# Patient Record
Sex: Male | Born: 1985 | Race: Black or African American | Hispanic: No | Marital: Married | Smoking: Former smoker
Health system: Southern US, Community
[De-identification: ages and names within clinical notes are randomized; demographics above are authoritative.]

## PROBLEM LIST (undated history)

## (undated) HISTORY — PX: WISDOM TOOTH EXTRACTION: SHX21

---

## 2006-11-22 ENCOUNTER — Emergency Department: Payer: Self-pay | Admitting: Emergency Medicine

## 2008-12-19 IMAGING — CR DG CHEST 2V
1 series · 2 of 2 positions shown · non-contrast
Comparison: none

REASON FOR EXAM: sob-[REDACTED]
COMMENTS:

PROCEDURE:     DXR - DXR CHEST PA (OR AP) AND LATERAL  - November 22, 2006 [DATE]
RESULT:     The lungs are mildly hyperinflated but clear. The heart and
pulmonary vascularity are normal in appearance. There is no evidence of
pneumothorax nor pleural effusion.

[Series 1: view not recorded · 0.17mm/px · 2 of 2 slices shown]
[im 1/2]
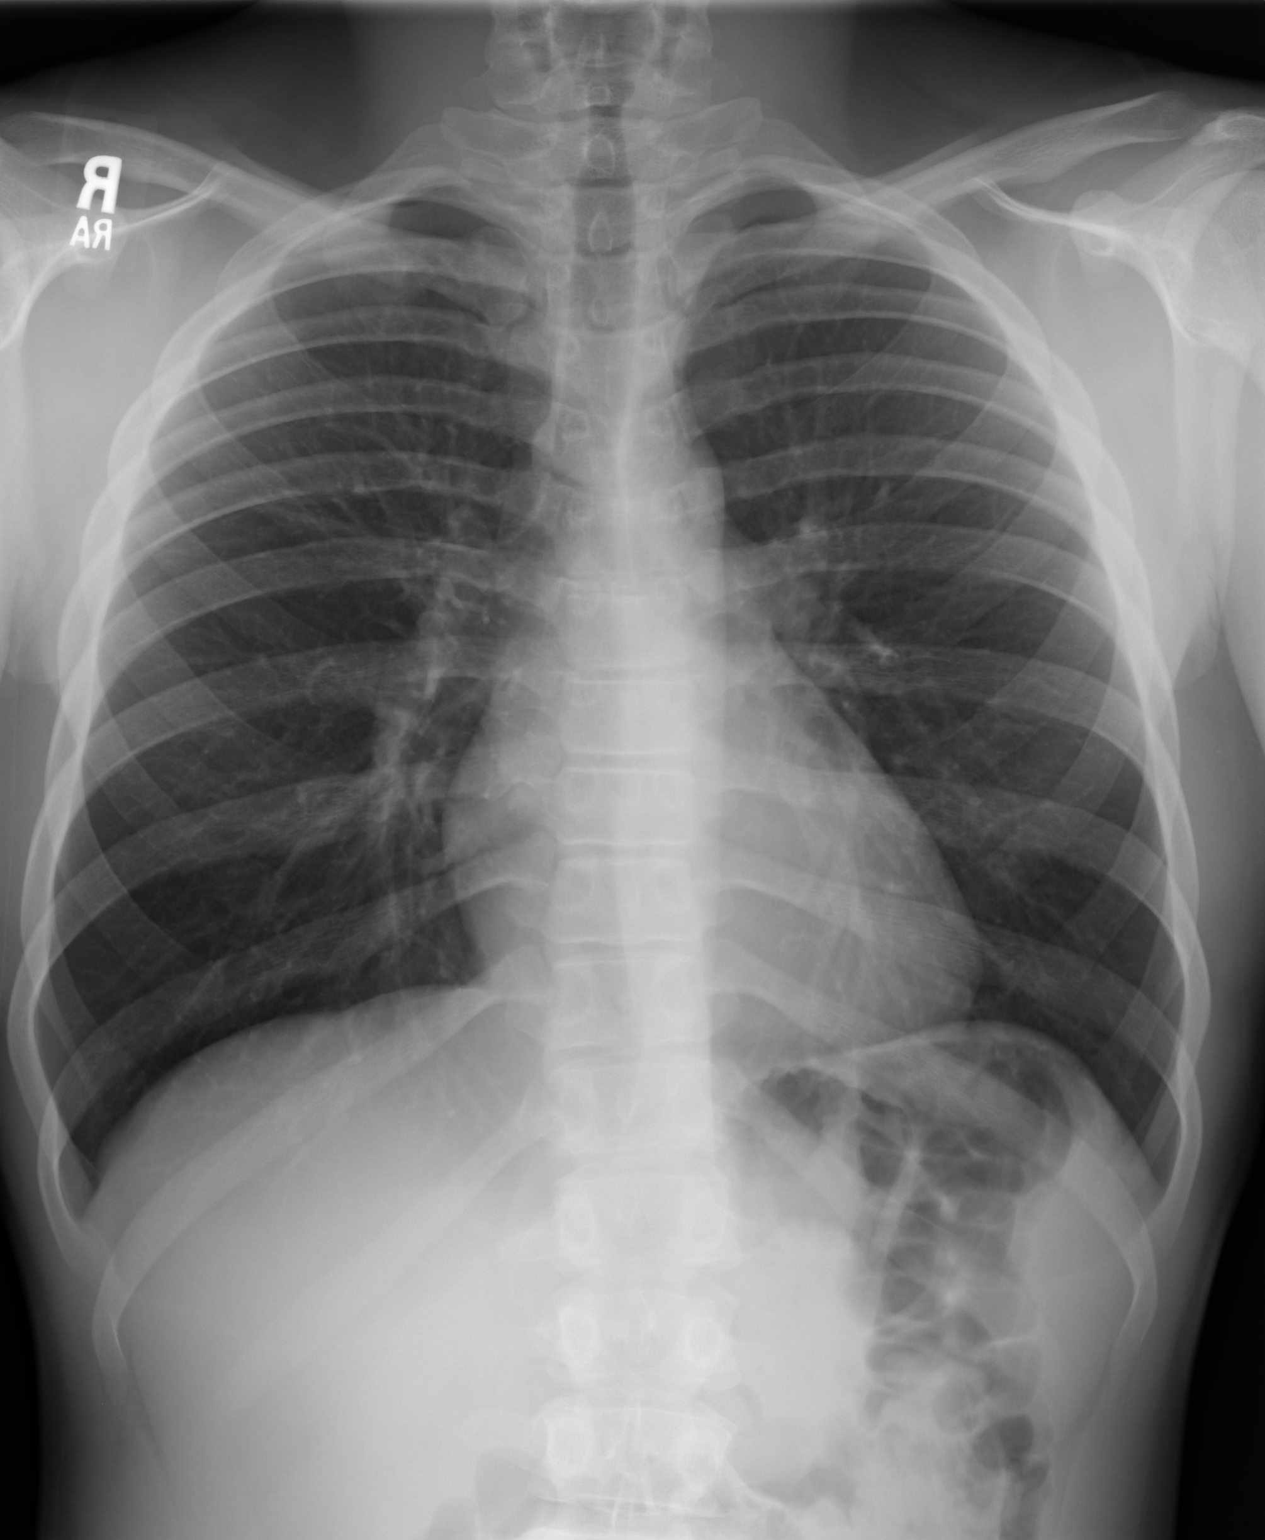
[im 2/2]
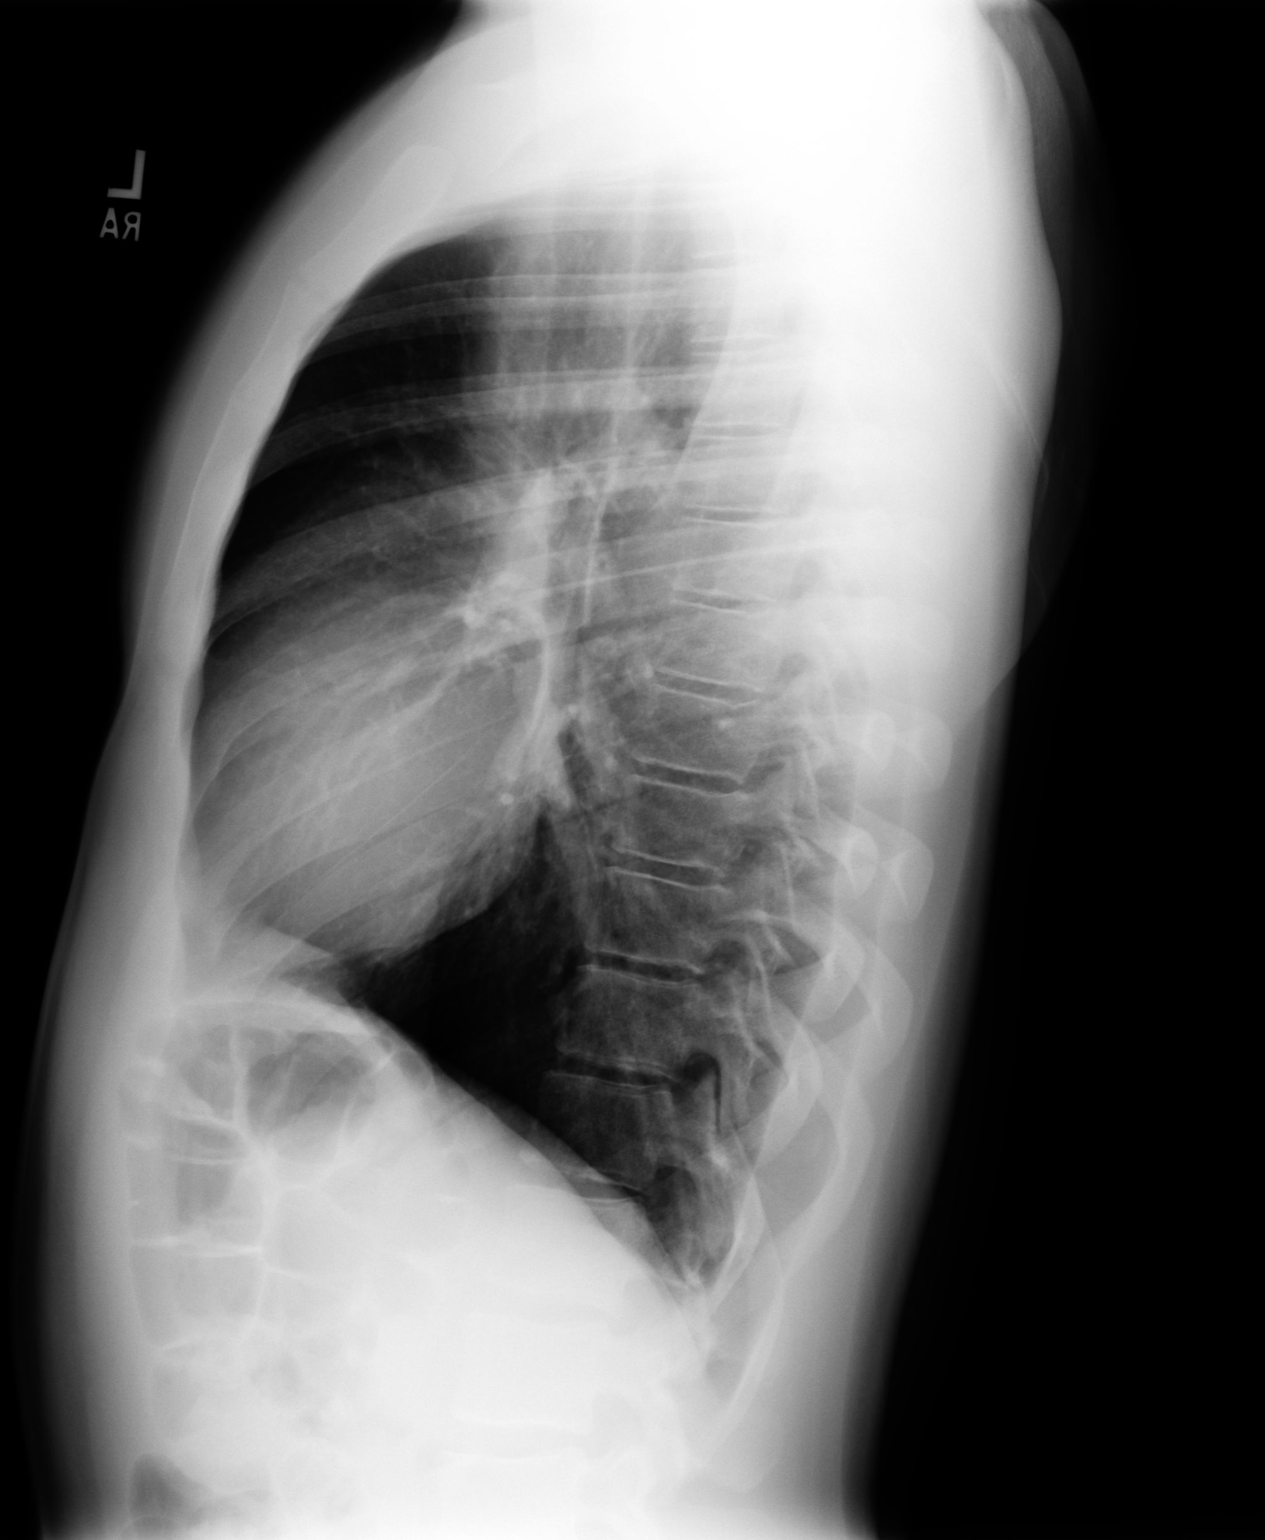

[2 of 2 positions shown; findings below may reference images not displayed]

IMPRESSION: There is mild hyperinflation which may be voluntary or which may reflect an
element of underlying reactive airway disease. I do not see evidence of
pneumonia.

## 2012-04-05 ENCOUNTER — Encounter (HOSPITAL_BASED_OUTPATIENT_CLINIC_OR_DEPARTMENT_OTHER): Payer: Self-pay | Admitting: *Deleted

## 2012-04-05 ENCOUNTER — Emergency Department (HOSPITAL_BASED_OUTPATIENT_CLINIC_OR_DEPARTMENT_OTHER)
Admission: EM | Admit: 2012-04-05 | Discharge: 2012-04-05 | Disposition: A | Discharge status: Home / Self Care | Payer: Self-pay | Attending: Emergency Medicine | Admitting: Emergency Medicine

## 2012-04-05 DIAGNOSIS — B353 Tinea pedis: Secondary | ICD-10-CM

## 2012-04-05 DIAGNOSIS — F17200 Nicotine dependence, unspecified, uncomplicated: Secondary | ICD-10-CM | POA: Insufficient documentation

## 2012-04-05 MED ORDER — TOLNAFTATE 1 % EX POWD: Freq: Two times a day (BID) | CUTANEOUS | Status: AC

## 2012-04-05 NOTE — ED Notes (Signed)
Pt c/o rash to bottom of foot near toes x 5 days and sts "I think it might be athlete's foot". Pt sts he tried otc med without relief.

## 2012-04-05 NOTE — ED Provider Notes (Signed)
History     CSN: [id number]  Arrival date & time 04/05/12  1519   First MD Initiated Contact with Patient 04/05/12 1537      Chief Complaint  Patient presents with  . Foot Pain    (Consider location/radiation/quality/duration/timing/severity/associated sxs/prior treatment) Patient is a 26 y.o. male presenting with rash. The history is provided by the patient.  Rash  The current episode started more than 2 days ago. The problem has been gradually worsening. The problem is associated with an unknown factor. There has been no fever. The rash is present on the right toes and right foot. The pain is at a severity of 1/10. The pain is mild. The pain has been intermittent since onset. Associated symptoms include pain. Pertinent negatives include no blisters, no itching and no weeping. Treatments tried: Has tried antifungal spray, no sig relief.    History reviewed. No pertinent past medical history.  History reviewed. No pertinent past surgical history.  History reviewed. No pertinent family history.  History  Substance Use Topics  . Smoking status: Current Everyday Smoker  . Smokeless tobacco: Not on file  . Alcohol Use: No      Review of Systems  Constitutional: Negative for fever and chills.  Respiratory: Negative for shortness of breath.   Musculoskeletal: Negative for joint swelling and arthralgias.  Skin: Positive for rash. Negative for itching and wound.    Allergies  Percocet  Home Medications  No current outpatient prescriptions on file.  BP 139/67  Pulse 112  Temp 98 F (36.7 C) (Oral)  Resp 16  Ht 5\' 9"  (1.753 m)  Wt 185 lb (83.915 kg)  BMI 27.32 kg/m2  SpO2 99%  Physical Exam  Nursing note and vitals reviewed. Constitutional: He is oriented to person, place, and time. He appears well-developed and well-nourished. No distress.  Musculoskeletal:       Right foot: He exhibits normal range of motion, normal capillary refill, no crepitus, no deformity and  no laceration.       Feet:  Neurological: He is alert and oriented to person, place, and time. He has normal strength. No sensory deficit.  Skin: Skin is warm.    ED Course  Procedures (including critical care time)  Labs Reviewed - No data to display No results found.   1. Athlete's foot       MDM  Pt with denuding skin likely from tinea pedis.  Counseled about cleaning feet, drying, changing shoes and socks in particular often and several weeks of treatment.  Will suggest powder instead of just spray.          Gavin Pound. Lorijean Husser, MD 04/05/12 1553

## 2012-04-05 NOTE — Discharge Instructions (Signed)
Athlete's Foot Athlete's foot (tinea pedis) is a fungal infection of the skin on the feet. It often occurs on the skin between the toes or underneath the toes. It can also occur on the soles of the feet. Athlete's foot is more likely to occur in hot, humid weather. Not washing your feet or changing your socks often enough can contribute to athlete's foot. The infection can spread from person to person (contagious). CAUSES Athlete's foot is caused by a fungus. This fungus thrives in warm, moist places. Most people get athlete's foot by sharing shower stalls, towels, and wet floors with an infected person. People with weakened immune systems, including those with diabetes, may be more likely to get athlete's foot. SYMPTOMS   Itchy areas between the toes or on the soles of the feet.   White, flaky, or scaly areas between the toes or on the soles of the feet.   Tiny, intensely itchy blisters between the toes or on the soles of the feet.   Tiny cuts on the skin. These cuts can develop a bacterial infection.   Thick or discolored toenails.  DIAGNOSIS  Your caregiver can usually tell what the problem is by doing a physical exam. Your caregiver may also take a skin sample from the rash area. The skin sample may be examined under a microscope, or it may be tested to see if fungus will grow in the sample. A sample may also be taken from your toenail for testing. TREATMENT  Over-the-counter and prescription medicines can be used to kill the fungus. These medicines are available as powders or creams. Your caregiver can suggest medicines for you. Fungal infections respond slowly to treatment. You may need to continue using your medicine for several weeks. PREVENTION   Do not share towels.   Wear sandals in wet areas, such as shared locker rooms and shared showers.   Keep your feet dry. Wear shoes that allow air to circulate. Wear cotton or wool socks.  HOME CARE INSTRUCTIONS   Take medicines as  directed by your caregiver. Do not use steroid creams on athlete's foot.   Keep your feet clean and cool. Wash your feet daily and dry them thoroughly, especially between your toes.   Change your socks every day. Wear cotton or wool socks. In hot climates, you may need to change your socks 2 to 3 times per day.   Wear sandals or canvas tennis shoes with good air circulation.   If you have blisters, soak your feet in Burow's solution or Epsom salts for 20 to 30 minutes, 2 times a day to dry out the blisters. Make sure you dry your feet thoroughly afterward.  SEEK MEDICAL CARE IF:   You have a fever.   You have swelling, soreness, warmth, or redness in your foot.   You are not getting better after 7 days of treatment.   You are not completely cured after 30 days.   You have any problems caused by your medicines.  MAKE SURE YOU:   Understand these instructions.   Will watch your condition.   Will get help right away if you are not doing well or get worse.  Document Released: 07/14/2000 Document Revised: 07/06/2011 Document Reviewed: 05/05/2011 [employer] Patient Information 2012 [employer]. 

## 2016-05-04 ENCOUNTER — Encounter (HOSPITAL_BASED_OUTPATIENT_CLINIC_OR_DEPARTMENT_OTHER): Payer: Self-pay | Admitting: *Deleted

## 2016-05-04 ENCOUNTER — Emergency Department (HOSPITAL_BASED_OUTPATIENT_CLINIC_OR_DEPARTMENT_OTHER)
Admission: EM | Admit: 2016-05-04 | Discharge: 2016-05-04 | Disposition: A | Discharge status: Home / Self Care | Payer: 59 | Attending: Emergency Medicine | Admitting: Emergency Medicine

## 2016-05-04 DIAGNOSIS — F17200 Nicotine dependence, unspecified, uncomplicated: Secondary | ICD-10-CM | POA: Insufficient documentation

## 2016-05-04 DIAGNOSIS — L0291 Cutaneous abscess, unspecified: Secondary | ICD-10-CM

## 2016-05-04 DIAGNOSIS — R0981 Nasal congestion: Secondary | ICD-10-CM | POA: Insufficient documentation

## 2016-05-04 DIAGNOSIS — R220 Localized swelling, mass and lump, head: Secondary | ICD-10-CM | POA: Diagnosis present

## 2016-05-04 NOTE — Discharge Instructions (Signed)
Try warm compresses.

## 2016-05-04 NOTE — ED Notes (Signed)
C/o swelling in upper lip and into nostrils onset last pm,  Denies any inj or sting

## 2016-05-04 NOTE — ED Provider Notes (Signed)
[facility] Note   CSN: [id number] Arrival date & time: 05/04/16  1936  By signing my name below, I, Ryan Powers, attest that this documentation has been prepared under the direction and in the presence of Ryan Plan, DO. Electronically Signed: Angelene Powers, ED Scribe. 05/04/16. 8:23 PM.   History   Chief Complaint Chief Complaint  Patient presents with  . Oral Swelling    HPI Comments: Ryan Powers is a 30 y.o. male who presents to the Emergency Department complaining of gradual onset, persistent moderately painful swelling to this upper lip that extends up into his nostrils onset yesterday. He reports associated nasal congestion and clear rhinorrhea. No alleviating factors noted. Pt states that he tried Benadryl at 7 am this morning with no relief. He denies any new foods or lotions. He has an allergy to percocet. He denies any fever, chills, difficulty breathing, or any generalized rash.  The history is provided by the patient. No language interpreter was used.    History reviewed. No pertinent past medical history.  There are no active problems to display for this patient.   History reviewed. No pertinent surgical history.     Home Medications    Prior to Admission medications   Medication Sig Start Date End Date Taking? Authorizing Provider  acetaminophen (TYLENOL) 500 MG tablet Take 1,000 mg by mouth every 6 (six) hours as needed. For headache.    Historical Provider, MD    Family History No family history on file.  Social History Social History  Substance Use Topics  . Smoking status: Current Every Day Smoker  . Smokeless tobacco: Never Used  . Alcohol use Yes     Allergies   Percocet [oxycodone-acetaminophen]   Review of Systems Review of Systems  Constitutional: Negative for chills and fever.  HENT: Positive for congestion, facial swelling and rhinorrhea.   Eyes: Negative for discharge and visual disturbance.    Respiratory: Negative for shortness of breath.   Cardiovascular: Negative for chest pain and palpitations.  Gastrointestinal: Negative for abdominal pain, diarrhea and vomiting.  Musculoskeletal: Negative for arthralgias and myalgias.  Skin: Negative for color change and rash.  Neurological: Negative for tremors, syncope and headaches.  Psychiatric/Behavioral: Negative for confusion and dysphoric mood.     Physical Exam Updated Vital Powers BP 123/72   Pulse 95   Temp 99 F (37.2 C) (Oral)   Resp 20   Ht 5\' 9"  (1.753 m)   Wt 197 lb (89.4 kg)   SpO2 99%   BMI 29.09 kg/m   Physical Exam  Constitutional: He is oriented to person, place, and time. He appears well-developed and well-nourished.  HENT:  Head: Normocephalic and atraumatic.  No appreciable swelling, tenderness to intersection of bilateral nares Swollen turbinates bilaterally No intra-oral lesions No lesions into upper lip  Eyes: EOM are normal. Pupils are equal, round, and reactive to light.  Neck: Normal range of motion. Neck supple. No JVD present.  Cardiovascular: Normal rate and regular rhythm.  Exam reveals no gallop and no friction rub.   No murmur heard. Pulmonary/Chest: No respiratory distress. He has no wheezes.  Abdominal: He exhibits no distension. There is no rebound and no guarding.  Musculoskeletal: Normal range of motion.  Neurological: He is alert and oriented to person, place, and time.  Skin: No rash noted. No pallor.  Psychiatric: He has a normal mood and affect. His behavior is normal.  Nursing note and vitals reviewed.    ED Treatments / Results  DIAGNOSTIC STUDIES: Oxygen Saturation is 99% on RA, normal by my interpretation.    COORDINATION OF CARE: 8:13 PM- Pt advised of Powers for treatment and pt agrees.    Labs (all labs ordered are listed, but only abnormal results are displayed) Labs Reviewed - No data to display  EKG  EKG Interpretation None       Radiology No results  found.  Procedures Procedures (including critical care time)  Medications Ordered in ED Medications - No data to display   Initial Impression / Assessment and Powers / ED Course  Ryan Powers and the nursing notes.  Pertinent labs & imaging results that were available during my care of the patient were reviewed by me and considered in my medical decision making (see chart for details).  Clinical Course    30 yo M With a chief complaint of swelling at the bottom of his nose. Going on for the past couple days. Has painful denies fevers or chills. Has some mild congestion. Denies other symptoms. Suspect that this is a small abscess though I'm not able to appreciate any significant areas. The patient try warm compresses Tylenol and ibuprofen.  10:20 PM:  I have discussed the diagnosis/risks/treatment options with the patient and family and believe the pt to be eligible for discharge home to follow-up with PCP. We also discussed returning to the ED immediately if new or worsening sx occur. We discussed the sx which are most concerning (e.g., sudden worsening pain, fever, inability to tolerate by mouth) that necessitate immediate return. Medications administered to the patient during their visit and any new prescriptions provided to the patient are listed below.  Medications given during this visit Medications - No data to display   The patient appears reasonably screen and/or stabilized for discharge and I doubt any other medical condition or other [facility] requiring further screening, evaluation, or treatment in the ED at this time prior to discharge.    Final Clinical Impressions(s) / ED Diagnoses   Final diagnoses:  Abscess    New Prescriptions Discharge Medication List as of 05/04/2016  8:14 PM      I personally performed the services described in this documentation, which was scribed in my presence. The recorded information has been reviewed and is  accurate.     Ryan Planan Anahi Belmar, DO 05/04/16 2220

## 2016-05-04 NOTE — ED Triage Notes (Signed)
Woke with swelling to his upper lip and nose. He took Benadryl at 7am. Swelling is no better this evening. Slight swelling noted. No respiratory distress.

## 2016-08-08 ENCOUNTER — Emergency Department (HOSPITAL_BASED_OUTPATIENT_CLINIC_OR_DEPARTMENT_OTHER)
Admission: EM | Admit: 2016-08-08 | Discharge: 2016-08-08 | Disposition: A | Discharge status: Home / Self Care | Payer: 59 | Attending: Emergency Medicine | Admitting: Emergency Medicine

## 2016-08-08 ENCOUNTER — Encounter (HOSPITAL_BASED_OUTPATIENT_CLINIC_OR_DEPARTMENT_OTHER): Payer: Self-pay | Admitting: Emergency Medicine

## 2016-08-08 DIAGNOSIS — S61312D Laceration without foreign body of right middle finger with damage to nail, subsequent encounter: Secondary | ICD-10-CM

## 2016-08-08 DIAGNOSIS — F17210 Nicotine dependence, cigarettes, uncomplicated: Secondary | ICD-10-CM | POA: Insufficient documentation

## 2016-08-08 DIAGNOSIS — W230XXD Caught, crushed, jammed, or pinched between moving objects, subsequent encounter: Secondary | ICD-10-CM | POA: Diagnosis not present

## 2016-08-08 MED ORDER — TRAMADOL HCL 50 MG PO TABS: 50.0000 mg | ORAL_TABLET | Freq: Two times a day (BID) | ORAL | 0 refills | Status: DC | PRN

## 2016-08-08 NOTE — ED Notes (Signed)
Pt requesting Rx for pain medication. Pt denied receiving any rx for pain at [facility] yesterday. Per Care everywhere pt received an Rx for tramadol for pain and was instructed to use the prescription that he already has for pain.

## 2016-08-08 NOTE — ED Triage Notes (Signed)
Lac to pad of right 3rd finger yesterday morning.  [facility]  Continues to bleed through dressing.

## 2016-08-08 NOTE — ED Provider Notes (Signed)
[facility]   CSN: [id number] Arrival date & time: 08/08/16  0023     History   Chief Complaint Chief Complaint  Patient presents with  . Extremity Laceration    HPI Ryan Powers is a 31 y.o. male  No sig PMH, here with bleeding from recent sutured site.  He states his finger was caught in a forklift at work yesterday.  He was seen at [facility] where the wound was sutured. In addition he states the tip of his finger was fractured. He did not receive any orthopedic follow-up. He was given tramadol for severe pain. He is concerned because the side of the wound continues to bleed intermittently. There are no further complaints.  10 Systems reviewed and are negative for acute change except as noted in the HPI.   HPI  History reviewed. No pertinent past medical history.  There are no active problems to display for this patient.   History reviewed. No pertinent surgical history.     Home Medications    Prior to Admission medications   Medication Sig Start Date End Date Taking? Authorizing Provider  acetaminophen (TYLENOL) 500 MG tablet Take 1,000 mg by mouth every 6 (six) hours as needed. For headache.    Historical Provider, MD  traMADol (ULTRAM) 50 MG tablet Take 1 tablet (50 mg total) by mouth 2 (two) times daily as needed. 08/08/16   Tomasita Crumble, MD    Family History No family history on file.  Social History Social History  Substance Use Topics  . Smoking status: Current Every Day Smoker    Packs/day: 0.50    Types: Cigarettes  . Smokeless tobacco: Never Used  . Alcohol use Yes     Comment: weekends     Allergies   Percocet [oxycodone-acetaminophen]   Review of Systems Review of Systems   Physical Exam Updated Vital Signs BP 141/84 (BP Location: Left Arm)   Pulse 70   Temp 98.2 F (36.8 C) (Oral)   Resp 16   Ht 5\' 9"  (1.753 m)   Wt 202 lb (91.6 kg)   SpO2 97%   BMI 29.83 kg/m   Physical Exam    Constitutional: He is oriented to person, place, and time. Vital signs are normal. He appears well-developed and well-nourished.  Non-toxic appearance. He does not appear ill. No distress.  HENT:  Head: Normocephalic and atraumatic.  Nose: Nose normal.  Mouth/Throat: Oropharynx is clear and moist. No oropharyngeal exudate.  Eyes: Conjunctivae and EOM are normal. Pupils are equal, round, and reactive to light. No scleral icterus.  Neck: Normal range of motion. Neck supple. No tracheal deviation, no edema, no erythema and normal range of motion present. No thyroid mass and no thyromegaly present.  Cardiovascular: Normal rate, regular rhythm, S1 normal, S2 normal, normal heart sounds, intact distal pulses and normal pulses.  Exam reveals no gallop and no friction rub.   No murmur heard. Pulmonary/Chest: Effort normal and breath sounds normal. No respiratory distress. He has no wheezes. He has no rhonchi. He has no rales.  Abdominal: Soft. Normal appearance and bowel sounds are normal. He exhibits no distension, no ascites and no mass. There is no hepatosplenomegaly. There is no tenderness. There is no rebound, no guarding and no CVA tenderness.  Musculoskeletal: He exhibits edema and tenderness.  The right third distal finger has 5 sutures in place including one to the nailbed. The wound is well approximated. No signs of active bleeding currently. There is  mild tenderness to palpation. No other signs of trauma the hand.  Lymphadenopathy:    He has no cervical adenopathy.  Neurological: He is alert and oriented to person, place, and time. He has normal strength. No cranial nerve deficit or sensory deficit.  Skin: Skin is warm, dry and intact. No petechiae and no rash noted. He is not diaphoretic. No erythema. No pallor.  Nursing note and vitals reviewed.    ED Treatments / Results  Labs (all labs ordered are listed, but only abnormal results are displayed) Labs Reviewed - No data to  display  EKG  EKG Interpretation None       Radiology No results found.  Procedures Procedures (including critical care time)  Medications Ordered in ED Medications - No data to display   Initial Impression / Assessment and Plan / ED Course  I have reviewed the triage vital signs and the nursing notes.  Pertinent labs & imaging results that were available during my care of the patient were reviewed by me and considered in my medical decision making (see chart for details).  Clinical Course     patient presents emergency department for bleeding through his wounnd.  Currently the bleeding stopped. There is a small area where can see where it had been bleeding. He may be using this finger is advised not to. His finger was not buddy taped together, we'll do this today since he has a fracture there. Also we'll give orthopedic hand surgery follow-up. Tetanus is updated at outside facility. He appears well in no acute distress, vital signs were within his normal limits and he is safe for discharge.  Final Clinical Impressions(s) / ED Diagnoses   Final diagnoses:  Laceration of right middle finger without foreign body with damage to nail, subsequent encounter    New Prescriptions New Prescriptions   TRAMADOL (ULTRAM) 50 MG TABLET    Take 1 tablet (50 mg total) by mouth 2 (two) times daily as needed.     Tomasita CrumbleAdeleke Freida Nebel, MD 08/08/16 [phone]

## 2018-03-15 ENCOUNTER — Emergency Department (HOSPITAL_COMMUNITY)
Admission: EM | Admit: 2018-03-15 | Discharge: 2018-03-15 | Discharge status: Against Medical Advice | Payer: 59 | Attending: Emergency Medicine | Admitting: Emergency Medicine

## 2018-03-15 ENCOUNTER — Other Ambulatory Visit: Payer: Self-pay

## 2018-03-15 ENCOUNTER — Encounter (HOSPITAL_COMMUNITY): Payer: Self-pay | Admitting: Emergency Medicine

## 2018-03-15 DIAGNOSIS — Z5321 Procedure and treatment not carried out due to patient leaving prior to being seen by health care provider: Secondary | ICD-10-CM | POA: Insufficient documentation

## 2018-03-15 DIAGNOSIS — R1084 Generalized abdominal pain: Secondary | ICD-10-CM

## 2018-03-15 DIAGNOSIS — R1031 Right lower quadrant pain: Secondary | ICD-10-CM | POA: Diagnosis present

## 2018-03-15 LAB — URINALYSIS, ROUTINE W REFLEX MICROSCOPIC
BILIRUBIN URINE: NEGATIVE
Glucose, UA: NEGATIVE mg/dL
HGB URINE DIPSTICK: NEGATIVE
Ketones, ur: NEGATIVE mg/dL
Leukocytes, UA: NEGATIVE
Nitrite: NEGATIVE
Protein, ur: NEGATIVE mg/dL
SPECIFIC GRAVITY, URINE: 1.028 (ref 1.005–1.030)
pH: 5 (ref 5.0–8.0)

## 2018-03-15 LAB — CBC WITH DIFFERENTIAL/PLATELET
Abs Immature Granulocytes: 0 10*3/uL (ref 0.0–0.1)
Basophils Absolute: 0 10*3/uL (ref 0.0–0.1)
Basophils Relative: 0 %
Eosinophils Absolute: 0.2 10*3/uL (ref 0.0–0.7)
Eosinophils Relative: 2 %
HCT: 47.3 % (ref 39.0–52.0)
HEMOGLOBIN: 15.5 g/dL (ref 13.0–17.0)
IMMATURE GRANULOCYTES: 0 %
LYMPHS ABS: 3.7 10*3/uL (ref 0.7–4.0)
Lymphocytes Relative: 36 %
MCH: 29.6 pg (ref 26.0–34.0)
MCHC: 32.8 g/dL (ref 30.0–36.0)
MCV: 90.4 fL (ref 78.0–100.0)
MONOS PCT: 7 %
Monocytes Absolute: 0.7 10*3/uL (ref 0.1–1.0)
Neutro Abs: 5.6 10*3/uL (ref 1.7–7.7)
Neutrophils Relative %: 55 %
PLATELETS: 214 10*3/uL (ref 150–400)
RBC: 5.23 MIL/uL (ref 4.22–5.81)
RDW: 13.4 % (ref 11.5–15.5)
WBC: 10.3 10*3/uL (ref 4.0–10.5)

## 2018-03-15 LAB — COMPREHENSIVE METABOLIC PANEL
ALK PHOS: 63 U/L (ref 38–126)
ALT: 23 U/L (ref 0–44)
AST: 23 U/L (ref 15–41)
Albumin: 4.4 g/dL (ref 3.5–5.0)
Anion gap: 9 (ref 5–15)
BUN: 12 mg/dL (ref 6–20)
CALCIUM: 9.3 mg/dL (ref 8.9–10.3)
CHLORIDE: 105 mmol/L (ref 98–111)
CO2: 26 mmol/L (ref 22–32)
CREATININE: 1.07 mg/dL (ref 0.61–1.24)
GFR calc Af Amer: >60 mL/min (ref >60)
Glucose, Bld: 87 mg/dL (ref 70–99)
Potassium: 3.8 mmol/L (ref 3.5–5.1)
Sodium: 140 mmol/L (ref 135–145)
Total Bilirubin: 1 mg/dL (ref 0.3–1.2)
Total Protein: 7.7 g/dL (ref 6.5–8.1)

## 2018-03-15 NOTE — ED Triage Notes (Signed)
Pt reports RLQ pain started Monday. Pt denies nausea, vomiting, changes in urination.

## 2018-03-15 NOTE — ED Notes (Signed)
Pt called for vital recheck. No response.  

## 2018-03-15 NOTE — ED Provider Notes (Cosign Needed)
Patient placed in Quick Look pathway, seen and evaluated   Chief Complaint: Abdominal pain  HPI: Patient presents with complaint of right lower quadrant abdominal pain, intermittently, worse with certain positions over the past 4 days.  Denies injury to the area.  Pain has gradually become worse in intensity.  Worse with palpation and movement.  He reports lack of appetite but no vomiting.  No diarrhea or change in bowel movements.  No testicular pain, swelling, pain with urination, penile drainage or discharge.  No treatments prior to arrival.  No previous abdominal surgery history.  ROS:  Positive ROS: (+) Abdominal pain, appetite change Negative ROS: (-) Fever, vomiting, diarrhea  Physical Exam:   Gen: No distress  Neuro: Awake and Alert  Skin: Warm    Focused Exam: Heart RRR, nml S1,S2, no m/r/g; Lungs CTAB; Abd soft, mild to moderate tenderness in the right lower and right middle abdomen, no rebound or guarding; Ext 2+ pedal pulses bilaterally, no edema.  BP 127/75 (BP Location: Right Arm)   Pulse 67   Temp 98.5 F (36.9 C) (Oral)   Resp 16   Ht 5\' 9"  (1.753 m)   Wt 84.8 kg   SpO2 100%   BMI 27.62 kg/m   Plan: Lab work.  Initiation of care has begun. The patient has been counseled on the process, plan, and necessity for staying for the completion/evaluation, and the remainder of the medical screening examination    Renne CriglerGeiple, Lesly Pontarelli, PA-C 03/15/18 1859

## 2018-03-15 NOTE — ED Notes (Signed)
Pt decided to leave to be seen somewhere else. RN encouraged pt to stay.

## 2018-10-27 ENCOUNTER — Encounter (HOSPITAL_BASED_OUTPATIENT_CLINIC_OR_DEPARTMENT_OTHER): Payer: Self-pay | Admitting: *Deleted

## 2018-10-27 ENCOUNTER — Emergency Department (HOSPITAL_BASED_OUTPATIENT_CLINIC_OR_DEPARTMENT_OTHER)
Admission: EM | Admit: 2018-10-27 | Discharge: 2018-10-27 | Disposition: A | Discharge status: Home / Self Care | Payer: 59 | Attending: Emergency Medicine | Admitting: Emergency Medicine

## 2018-10-27 ENCOUNTER — Other Ambulatory Visit: Payer: Self-pay

## 2018-10-27 DIAGNOSIS — L739 Follicular disorder, unspecified: Secondary | ICD-10-CM | POA: Diagnosis not present

## 2018-10-27 DIAGNOSIS — L0291 Cutaneous abscess, unspecified: Secondary | ICD-10-CM | POA: Diagnosis present

## 2018-10-27 DIAGNOSIS — F17210 Nicotine dependence, cigarettes, uncomplicated: Secondary | ICD-10-CM | POA: Diagnosis not present

## 2018-10-27 MED ORDER — SULFAMETHOXAZOLE-TRIMETHOPRIM 800-160 MG PO TABS: 1.0000 | ORAL_TABLET | Freq: Two times a day (BID) | ORAL | 0 refills | Status: AC

## 2018-10-27 MED ORDER — LIDOCAINE-EPINEPHRINE (PF) 2 %-1:200000 IJ SOLN
10.0000 mL | Freq: Once | INTRAMUSCULAR | Status: DC
  Filled 2018-10-27 (×2): qty 10

## 2018-10-27 NOTE — ED Provider Notes (Signed)
[facility] Emergency Department Provider Note MRN:  [id number]  Arrival date & time: 10/27/18     Chief Complaint   Abscess   History of Present Illness   Ryan Powers is a 33 y.o. year-old male with no pertinent past medical history presenting to the ED with chief complaint of abscess.  Patient has developed a small abscess to the middle of his back for the past few days.  He explains that his girlfriend saw that it was coming to ahead and tried to pop it today, but this was unsuccessful and only caused more pain.  Pain is constant, currently 7 out of 10 in severity, worse with palpation.  Patient denies fever, does not get these abscesses frequently.  Review of Systems  A problem-focused ROS was performed. Positive for abscess.  Patient denies fever.  Patient's Health History   History reviewed. No pertinent past medical history.  Past Surgical History:  Procedure Laterality Date  . WISDOM TOOTH EXTRACTION      No family history on file.  Social History   Socioeconomic History  . Marital status: Single    Spouse name: Not on file  . Number of children: Not on file  . Years of education: Not on file  . Highest education level: Not on file  Occupational History  . Not on file  Social Needs  . Financial resource strain: Not on file  . Food insecurity:    Worry: Not on file    Inability: Not on file  . Transportation needs:    Medical: Not on file    Non-medical: Not on file  Tobacco Use  . Smoking status: Current Every Day Smoker    Packs/day: 0.50    Types: Cigarettes  . Smokeless tobacco: Never Used  Substance and Sexual Activity  . Alcohol use: Yes    Comment: weekends  . Drug use: No  . Sexual activity: Not on file  Lifestyle  . Physical activity:    Days per week: Not on file    Minutes per session: Not on file  . Stress: Not on file  Relationships  . Social connections:    Talks on phone: Not on file    Gets together:  Not on file    Attends religious service: Not on file    Active member of club or organization: Not on file    Attends meetings of clubs or organizations: Not on file    Relationship status: Not on file  . Intimate partner violence:    Fear of current or ex partner: Not on file    Emotionally abused: Not on file    Physically abused: Not on file    Forced sexual activity: Not on file  Other Topics Concern  . Not on file  Social History Narrative  . Not on file     Physical Exam  Vital Signs and Nursing Notes reviewed Vitals:   10/27/18 2139  BP: 124/81  Pulse: 78  Resp: 16  Temp: 98.4 F (36.9 C)  SpO2: 100%    CONSTITUTIONAL: Well-appearing, NAD NEURO:  Alert and oriented x 3, no focal deficits EYES:  eyes equal and reactive ENT/NECK:  no LAD, no JVD CARDIO: Regular rate, well-perfused, normal S1 and S2 PULM:  CTAB no wheezing or rhonchi GI/GU:  normal bowel sounds, non-distended, non-tender MSK/SPINE:  No gross deformities, no edema SKIN: 4 to 5 cm area of circular erythema and induration to the mid thoracic back with no  fluctuance, moderately tender to palpation PSYCH:  Appropriate speech and behavior  Diagnostic and Interventional Summary    Labs Reviewed - No data to display  No orders to display    Medications - No data to display   Procedures  EMERGENCY DEPARTMENT US SOFT TISSUE INTERPRETATION "Study: Limited Soft Tissue Ultrasound"  INDICATIONS: Pain and Soft tissue infection Multiple views of the body part were obtained in real-time with a multi-frequency linear probe  PERFORMED BY: Myself IMAGES ARCHIVED?: Yes SIDE:Midline BODY PART:Upper back INTERPRETATION:  No abcess noted and Cellulitis present    Critical Care  ED Course and Medical Decision Making  I have reviewed the triage vital signs and the nursing notes.  Pertinent labs & imaging results that were available during my care of the patient were reviewed by me and considered in my  medical decision making (see below for details).  Uncomplicated folliculitis and cellulitis with ultrasound revealing no large fluid collection amenable to drainage.  Prescription for Bactrim.  Advised warm compresses.  After the discussed management above, the patient was determined to be safe for discharge.  The patient was in agreement with this plan and all questions regarding their care were answered.  ED return precautions were discussed and the patient will return to the ED with any significant worsening of condition.  Elmer Sow. Pilar Plate, MD [facility] Emergency Medicine [facility] [email or web address]  Final Clinical Impressions(s) / ED Diagnoses     ICD-10-CM   1. Folliculitis L73.9     ED Discharge Orders         Ordered    sulfamethoxazole-trimethoprim (BACTRIM DS,SEPTRA DS) 800-160 MG tablet  2 times daily     10/27/18 2202             Sabas Sous, MD 10/27/18 2207

## 2018-10-27 NOTE — Discharge Instructions (Addendum)
You were evaluated in the Emergency Department and after careful evaluation, we did not find any emergent condition requiring admission or further testing in the hospital.  Your symptoms today seem to be due to inflammation and infection of the hair follicles as well as some infection of the surrounding skin.  The ultrasound of the area did not show any large fluid collection that would need drainage.  We recommend warm compresses and taking the antibiotics as directed.  Please return to the Emergency Department if you experience any worsening of your condition.  We encourage you to follow up with a primary care provider.  Thank you for allowing [place] to be a part of your care.

## 2018-10-27 NOTE — ED Triage Notes (Signed)
Pt reports bump upper back yesterday. Friend tried to pop it today and now area is larger and more painful

## 2019-07-21 ENCOUNTER — Other Ambulatory Visit: Payer: Self-pay

## 2019-07-21 ENCOUNTER — Encounter (HOSPITAL_BASED_OUTPATIENT_CLINIC_OR_DEPARTMENT_OTHER): Payer: Self-pay | Admitting: *Deleted

## 2019-07-21 ENCOUNTER — Emergency Department (HOSPITAL_BASED_OUTPATIENT_CLINIC_OR_DEPARTMENT_OTHER)
Admission: EM | Admit: 2019-07-21 | Discharge: 2019-07-21 | Disposition: A | Discharge status: Home / Self Care | Payer: 59 | Attending: Emergency Medicine | Admitting: Emergency Medicine

## 2019-07-21 DIAGNOSIS — H6002 Abscess of left external ear: Secondary | ICD-10-CM | POA: Insufficient documentation

## 2019-07-21 DIAGNOSIS — H9202 Otalgia, left ear: Secondary | ICD-10-CM | POA: Diagnosis present

## 2019-07-21 DIAGNOSIS — F17210 Nicotine dependence, cigarettes, uncomplicated: Secondary | ICD-10-CM | POA: Insufficient documentation

## 2019-07-21 MED ORDER — CIPROFLOXACIN HCL 500 MG PO TABS: 500.0000 mg | ORAL_TABLET | Freq: Two times a day (BID) | ORAL | 0 refills | Status: DC

## 2019-07-21 MED ORDER — LIDOCAINE HCL (PF) 1 % IJ SOLN
5.0000 mL | Freq: Once | INTRAMUSCULAR | Status: AC
  Administered 2019-07-21: 5 mL

## 2019-07-21 NOTE — ED Provider Notes (Signed)
[facility] EMERGENCY DEPARTMENT Provider Note   CSN: [id number] Arrival date & time: 07/21/19  1341     History Chief Complaint  Patient presents with  . Cyst    Ryan Powers is a 33 y.o. male presenting for evaluation of pain and swelling of the left earlobe.  Patient states he has noticed a bump behind his left earlobe for several weeks.  Recently, has become more painful and swollen.  He noticed a similar bump today on the other side which is not as big.  Patient states nothing is draining from it.  No trauma or injury.  He has had an infection of his earlobe before, this feels similar.  He has no other medical problems, takes no medications daily.  His ears are pierced, this was done 15 years ago.  HPI     History reviewed. No pertinent past medical history.  There are no problems to display for this patient.   Past Surgical History:  Procedure Laterality Date  . WISDOM TOOTH EXTRACTION         History reviewed. No pertinent family history.  Social History   Tobacco Use  . Smoking status: Current Every Day Smoker    Packs/day: 0.50    Types: Cigarettes  . Smokeless tobacco: Never Used  Substance Use Topics  . Alcohol use: Yes    Comment: weekends  . Drug use: No    Home Medications Prior to Admission medications   Not on File    Allergies    Percocet [oxycodone-acetaminophen]  Review of Systems   Review of Systems  Constitutional: Negative for fever.  HENT:       Pain and swelling of L posterior ear lobe    Physical Exam Updated Vital Signs BP 131/81 (BP Location: Left Arm)   Pulse 68   Temp 98.5 F (36.9 C)   Resp 16   Ht 5\' 9"  (1.753 m)   Wt 90.7 kg   SpO2 99%   BMI 29.53 kg/m   Physical Exam Vitals and nursing note reviewed.  Constitutional:      General: He is not in acute distress.    Appearance: He is well-developed.  HENT:     Head: Normocephalic and atraumatic.     Comments: See picture below.  Approximately  1 cm area of tenderness and fluctuance with a purulent head behind the left earlobe.  Area of tenderness without fluctuance or purulent head noted behind the right earlobe, significantly smaller.  No active drainage. Pulmonary:     Effort: Pulmonary effort is normal.  Abdominal:     General: There is no distension.  Musculoskeletal:        General: Normal range of motion.     Cervical back: Normal range of motion.  Skin:    General: Skin is warm.     Findings: No rash.  Neurological:     Mental Status: He is alert and oriented to person, place, and time.            ED Results / Procedures / Treatments   Labs (all labs ordered are listed, but only abnormal results are displayed) Labs Reviewed - No data to display  EKG None  Radiology No results found.  Procedures .Marland KitchenIncision and Drainage  Date/Time: 07/21/2019 4:30 PM Performed by: Franchot Heidelberg, PA-C Authorized by: Franchot Heidelberg, PA-C   Consent:    Consent obtained:  Verbal   Consent given by:  Patient   Risks discussed:  Bleeding, damage to  other organs, incomplete drainage, infection and pain Location:    Location:  Head   Head location:  L external ear Pre-procedure details:    Skin preparation:  Betadine Anesthesia (see MAR for exact dosages):    Anesthesia method:  Local infiltration   Local anesthetic:  Lidocaine 1% w/o epi Procedure type:    Complexity:  Simple Procedure details:    Needle aspiration: no     Incision types:  Single straight   Incision depth:  Dermal   Scalpel blade:  11   Wound management:  Probed and deloculated   Drainage:  Bloody   Drainage amount:  Moderate   Wound treatment:  Wound left open   Packing materials:  None Post-procedure details:    Patient tolerance of procedure:  Tolerated well, no immediate complications   (including critical care time)  Medications Ordered in ED Medications  lidocaine (PF) (XYLOCAINE) 1 % injection 5 mL (5 mLs Infiltration  Given by Other 07/21/19 1553)    ED Course  I have reviewed the triage vital signs and the nursing notes.  Pertinent labs & imaging results that were available during my care of the patient were reviewed by me and considered in my medical decision making (see chart for details).    MDM Rules/Calculators/A&P                      Patient presenting for evaluation of left earlobe pain.  Physical exam consistent with abscess.  On the right side, patient has a small tender area, but no obvious fluctuance.  Discussed I&D of the left earlobe with patient, however antibiotics and monitoring of the right ear, patient is agreeable. I&D performed as described above.  Discussed aftercare instructions. As infections are of the ear, will tx with cipro. Pt to f/u with ENT as needed. At this time, pt appears safe for d/c. Return precautions given. Pt states he understands and agrees to plan.   Final Clinical Impression(s) / ED Diagnoses Final diagnoses:  Abscess of left earlobe    Rx / DC Orders ED Discharge Orders         Ordered    ciprofloxacin (CIPRO) 500 MG tablet  Every 12 hours,   Status:  Discontinued     07/21/19 1613           Addelyn Alleman, PA-C 07/21/19 1632    Vanetta Mulders, MD 07/24/19 [phone]

## 2019-07-21 NOTE — ED Notes (Signed)
ED Provider at bedside. 

## 2019-07-21 NOTE — Discharge Instructions (Signed)
Take antibiotics as prescribed.  Take the entire course, even if your symptoms improve. Use Tylenol and ibuprofen as needed for pain. Wash daily with soap and water. Apply a Band-Aid to help with bleeding or drainage until this resolves. Follow-up with ear nose and throat doctor listed below if your symptoms are not improving. Return to the emergency room with any new, worsening, concerning symptoms.

## 2019-07-21 NOTE — ED Triage Notes (Signed)
C/o cyst to left ear  lobe x 3 weeks

## 2019-08-04 ENCOUNTER — Emergency Department (HOSPITAL_BASED_OUTPATIENT_CLINIC_OR_DEPARTMENT_OTHER)
Admission: EM | Admit: 2019-08-04 | Discharge: 2019-08-04 | Disposition: A | Discharge status: Home / Self Care | Payer: 59 | Attending: Emergency Medicine | Admitting: Emergency Medicine

## 2019-08-04 ENCOUNTER — Encounter (HOSPITAL_BASED_OUTPATIENT_CLINIC_OR_DEPARTMENT_OTHER): Payer: Self-pay | Admitting: Emergency Medicine

## 2019-08-04 ENCOUNTER — Other Ambulatory Visit: Payer: Self-pay

## 2019-08-04 DIAGNOSIS — F17210 Nicotine dependence, cigarettes, uncomplicated: Secondary | ICD-10-CM | POA: Diagnosis not present

## 2019-08-04 DIAGNOSIS — H6001 Abscess of right external ear: Secondary | ICD-10-CM | POA: Diagnosis present

## 2019-08-04 DIAGNOSIS — Z885 Allergy status to narcotic agent status: Secondary | ICD-10-CM | POA: Diagnosis not present

## 2019-08-04 DIAGNOSIS — L0291 Cutaneous abscess, unspecified: Secondary | ICD-10-CM

## 2019-08-04 MED ORDER — LIDOCAINE HCL (PF) 1 % IJ SOLN
2.0000 mL | Freq: Once | INTRAMUSCULAR | Status: AC
Start: 2019-08-04 — End: 2019-08-04
  Administered 2019-08-04: 14:00:00 2 mL via INTRADERMAL

## 2019-08-04 MED ORDER — CLINDAMYCIN HCL 300 MG PO CAPS: 300.0000 mg | ORAL_CAPSULE | Freq: Three times a day (TID) | ORAL | 0 refills | Status: AC

## 2019-08-04 NOTE — Discharge Instructions (Addendum)
We have drained the abscess on your right ear on today's visit.  Please keep this area clean and dry.    We discussed the fact that you recently completed a course of antibiotics, I have provided a new prescription for antibiotics, you may not start these unless you began showing signs of infection such as redness, pain, fever.  We will ultimately need to change the way the mask wraps around the back of your ears, as this seems to be causing your abscesses.

## 2019-08-04 NOTE — ED Provider Notes (Signed)
[facility] EMERGENCY DEPARTMENT Provider Note   CSN: [id number] Arrival date & time: 08/04/19  1101     History Chief Complaint  Patient presents with  . Abscess    Ryan Powers is a 34 y.o. male.  34 y.o male with no PMH presents to the ED with a chief complaint of abscess. Patient reports he was seen in the ED 2 weeks ago, had an abscess to his left ear lobe drained then. Today he reports the right abscess now has fluctuance present and has gotten larger.  Patient did complete a 10-day course of ciprofloxacin according to his last visit.  Patient has attempted warm compresses, cleaning the area, draining this at home without improvement.  He reports the left abscesses complete resolve however right persist. Denies any fevers, changes in hearing, pain to the area.  He has  The history is provided by the patient.       History reviewed. No pertinent past medical history.  There are no problems to display for this patient.   Past Surgical History:  Procedure Laterality Date  . WISDOM TOOTH EXTRACTION         History reviewed. No pertinent family history.  Social History   Tobacco Use  . Smoking status: Current Every Day Smoker    Packs/day: 0.50    Types: Cigarettes  . Smokeless tobacco: Never Used  Substance Use Topics  . Alcohol use: Yes    Comment: weekends  . Drug use: No    Home Medications Prior to Admission medications   Medication Sig Start Date End Date Taking? Authorizing Provider  clindamycin (CLEOCIN) 300 MG capsule Take 1 capsule (300 mg total) by mouth 3 (three) times daily for 7 days. 08/04/19 08/11/19  Claude Manges, PA-C    Allergies    Percocet [oxycodone-acetaminophen]  Review of Systems   Review of Systems  Constitutional: Negative for fever.  Respiratory: Negative for shortness of breath.   Skin: Positive for wound.    Physical Exam Updated Vital Signs BP 116/72 (BP Location: Left Arm)   Pulse 71   Temp 98.4 F (36.9 C)  (Oral)   Resp 16   Ht 5\' 8"  (1.727 m)   Wt 90.7 kg   SpO2 99%   BMI 30.41 kg/m   Physical Exam Vitals and nursing note reviewed.  Constitutional:      Appearance: He is well-developed.  HENT:     Head: Normocephalic and atraumatic.     Right Ear: Hearing normal.     Ears:     Comments: Circular fluctuant with surrounding erythema abscess behind right earlobe. Eyes:     General: No scleral icterus.    Pupils: Pupils are equal, round, and reactive to light.  Cardiovascular:     Heart sounds: Normal heart sounds.  Pulmonary:     Effort: Pulmonary effort is normal.     Breath sounds: Normal breath sounds. No wheezing.  Chest:     Chest wall: No tenderness.  Abdominal:     General: Bowel sounds are normal. There is no distension.     Palpations: Abdomen is soft.     Tenderness: There is no abdominal tenderness.  Musculoskeletal:        General: No tenderness or deformity.     Cervical back: Normal range of motion.  Skin:    General: Skin is warm and dry.  Neurological:     Mental Status: He is alert and oriented to person, place, and time.  ED Results / Procedures / Treatments   Labs (all labs ordered are listed, but only abnormal results are displayed) Labs Reviewed - No data to display  EKG None  Radiology No results found.  Procedures .Marland KitchenIncision and Drainage  Date/Time: 08/04/2019 1:22 PM Performed by: Claude Manges, PA-C Authorized by: Claude Manges, PA-C   Consent:    Consent given by:  Patient   Risks discussed:  Bleeding, incomplete drainage, infection and pain   Alternatives discussed:  No treatment Location:    Type:  Abscess   Size:  2   Location:  Head   Head location:  R external ear Pre-procedure details:    Skin preparation:  Antiseptic wash Anesthesia (see MAR for exact dosages):    Anesthesia method:  Local infiltration   Local anesthetic:  Lidocaine 1% w/o epi Procedure type:    Complexity:  Simple Procedure details:    Incision  types:  Stab incision   Scalpel blade:  11   Wound management:  Probed and deloculated and irrigated with saline   Drainage:  Bloody and purulent   Drainage amount:  Moderate   Packing materials:  None Post-procedure details:    Patient tolerance of procedure:  Tolerated well, no immediate complications   (including critical care time)  Medications Ordered in ED Medications  lidocaine (PF) (XYLOCAINE) 1 % injection 2 mL (has no administration in time range)    ED Course  I have reviewed the triage vital signs and the nursing notes.  Pertinent labs & imaging results that were available during my care of the patient were reviewed by me and considered in my medical decision making (see chart for details).    MDM Rules/Calculators/A&P  Patient with no pertinent past medical history presents to the ED with complaints of abscess high in his right ear, had a similar episode 2 weeks ago where he was seen in the ED and had a left abscess drained.  He was then placed on ciprofloxacin for a 10-day course without improvement.  He has attempted draining the right abscess at home, has tried a warm compresses without improvement.  During my evaluation there is significant amount of fluctuance, there is no active draining at this time.  Patient is requesting I&D, will order lidocaine along with further drain this abscess.     Patient does wear a cloth mask, this rubs against the part of the ear that has had the abscesses in the past week, he was encouraged to obtain a different style of mask to prevent this from further reoccurring.  I personally drained patient's abscess to the back of his ear, he tolerated the procedure well, serosanguineous drainage was obtained.  He tolerated the procedure without any complications.  Patient recently completed a course of Cipro, discussed risks and benefits of providing him with a second round of antibiotic therapy.  I will provide a prescription for clindamycin  at today's visit, however patient has been instructed not to begin this unless he begins showing signs of infection.  He understands and agrees with management.  Return precautions discussed at length.   Portions of this note were generated with Scientist, clinical (histocompatibility and immunogenetics). Dictation errors may occur despite best attempts at proofreading.  Final Clinical Impression(s) / ED Diagnoses Final diagnoses:  Abscess    Rx / DC Orders ED Discharge Orders         Ordered    clindamycin (CLEOCIN) 300 MG capsule  3 times daily     08/04/19 1323  Janeece Fitting, PA-C 08/04/19 1338    Dorie Rank, MD 08/05/19 [phone]

## 2019-08-04 NOTE — ED Notes (Signed)
Pt no longer in the room. He is trying to check out. The patient given his D/c instructions and states understanding  - did not sign for the papers since he was in the lobby

## 2019-08-04 NOTE — ED Triage Notes (Signed)
States that he has a boil behind his right ear. He was here 2 weeks ago " it wasn't big enough to do anything with" - pt states that it is bigger now

## 2020-02-17 ENCOUNTER — Encounter (HOSPITAL_BASED_OUTPATIENT_CLINIC_OR_DEPARTMENT_OTHER): Payer: Self-pay | Admitting: Emergency Medicine

## 2020-02-17 ENCOUNTER — Emergency Department (HOSPITAL_BASED_OUTPATIENT_CLINIC_OR_DEPARTMENT_OTHER)
Admission: EM | Admit: 2020-02-17 | Discharge: 2020-02-17 | Disposition: A | Discharge status: Home / Self Care | Payer: PRIVATE HEALTH INSURANCE | Attending: Emergency Medicine | Admitting: Emergency Medicine

## 2020-02-17 ENCOUNTER — Other Ambulatory Visit: Payer: Self-pay

## 2020-02-17 DIAGNOSIS — G43909 Migraine, unspecified, not intractable, without status migrainosus: Secondary | ICD-10-CM | POA: Insufficient documentation

## 2020-02-17 DIAGNOSIS — Z87891 Personal history of nicotine dependence: Secondary | ICD-10-CM | POA: Insufficient documentation

## 2020-02-17 MED ORDER — DIPHENHYDRAMINE HCL 50 MG/ML IJ SOLN
25.0000 mg | Freq: Once | INTRAMUSCULAR | Status: AC
  Administered 2020-02-17: 25 mg via INTRAVENOUS
  Filled 2020-02-17: qty 1

## 2020-02-17 MED ORDER — DOXYCYCLINE HYCLATE 100 MG PO CAPS: 100.0000 mg | ORAL_CAPSULE | Freq: Two times a day (BID) | ORAL | 0 refills | Status: AC

## 2020-02-17 MED ORDER — SODIUM CHLORIDE 0.9 % IV BOLUS
1000.0000 mL | Freq: Once | INTRAVENOUS | Status: AC
  Administered 2020-02-17: 1000 mL via INTRAVENOUS

## 2020-02-17 MED ORDER — PROCHLORPERAZINE EDISYLATE 10 MG/2ML IJ SOLN
10.0000 mg | Freq: Once | INTRAMUSCULAR | Status: AC
  Administered 2020-02-17: 10 mg via INTRAVENOUS
  Filled 2020-02-17: qty 2

## 2020-02-17 MED ORDER — KETOROLAC TROMETHAMINE 15 MG/ML IJ SOLN
15.0000 mg | Freq: Once | INTRAMUSCULAR | Status: AC
  Administered 2020-02-17: 15 mg via INTRAVENOUS
  Filled 2020-02-17: qty 1

## 2020-02-17 NOTE — Discharge Instructions (Addendum)
You were evaluated in the Emergency Department and after careful evaluation, we did not find any emergent condition requiring admission or further testing in the hospital.  Your exam/testing today was overall reassuring.  We encourage you to follow-up with a primary care doctor to discuss your headaches, they may be able to start you on a preventative medication.  Regarding your bug bite, we recommend over-the-counter Benadryl cream and warm compresses to the area over the next few days.  If the redness surrounding the area worsens over the next 2 or 3 days, you can fill the prescription for the antibiotic and take as directed.  Please return to the Emergency Department if you experience any worsening of your condition.  Thank you for allowing [place] to be a part of your care.

## 2020-02-17 NOTE — ED Provider Notes (Signed)
[facility]PT [facility] Emergency Department Provider Note MRN:  [id number]  Arrival date & time: 02/17/20     Chief Complaint   Migraine   History of Present Illness   Ryan Powers is a 34 y.o. year-old male with no pertinent past medical history presenting to the ED with chief complaint of migraine.  Patient explains he has struggled with headaches for most of his adult life.  Worsening headache over the past week intermittently, constant for the past day.  Gradual onset, moderate to severe, located in the frontal region behind the right eye, similar to prior headaches.  Associated with nausea and single episode emesis on the way here.  Denies numbness or weakness to the arms or legs, no fever, no other complaints.  Worse with bright lights.  Review of Systems  A complete 10 system review of systems was obtained and all systems are negative except as noted in the HPI and PMH.   Patient's Health History   History reviewed. No pertinent past medical history.  Past Surgical History:  Procedure Laterality Date  . WISDOM TOOTH EXTRACTION      History reviewed. No pertinent family history.  Social History   Socioeconomic History  . Marital status: Married    Spouse name: Not on file  . Number of children: Not on file  . Years of education: Not on file  . Highest education level: Not on file  Occupational History  . Not on file  Tobacco Use  . Smoking status: Former Smoker    Packs/day: 0.50    Types: Cigarettes    Quit date: 02/17/2019    Years since quitting: 1.0  . Smokeless tobacco: Never Used  Vaping Use  . Vaping Use: Never used  Substance and Sexual Activity  . Alcohol use: Yes    Comment: weekends  . Drug use: No  . Sexual activity: Not on file  Other Topics Concern  . Not on file  Social History Narrative  . Not on file   Social Determinants of Health   Financial Resource Strain:   . Difficulty of Paying Living Expenses:   Food  Insecurity:   . Worried About Programme researcher, broadcasting/film/video in the Last Year:   . Barista in the Last Year:   Transportation Needs:   . Freight forwarder (Medical):   Marland Kitchen Lack of Transportation (Non-Medical):   Physical Activity:   . Days of Exercise per Week:   . Minutes of Exercise per Session:   Stress:   . Feeling of Stress :   Social Connections:   . Frequency of Communication with Friends and Family:   . Frequency of Social Gatherings with Friends and Family:   . Attends Religious Services:   . Active Member of Clubs or Organizations:   . Attends Banker Meetings:   Marland Kitchen Marital Status:   Intimate Partner Violence:   . Fear of Current or Ex-Partner:   . Emotionally Abused:   Marland Kitchen Physically Abused:   . Sexually Abused:      Physical Exam   Vitals:   02/17/20 0610  BP: 120/86  Pulse: 63  Resp: 14  Temp: 98 F (36.7 C)  SpO2: 99%    CONSTITUTIONAL: Well-appearing, NAD NEURO:  Alert and oriented x 3, normal and symmetric strength and sensation, normal coordination, normal speech EYES:  eyes equal and reactive ENT/NECK:  no LAD, no JVD CARDIO: Regular rate, well-perfused, normal S1 and S2 PULM:  CTAB  no wheezing or rhonchi GI/GU:  normal bowel sounds, non-distended, non-tender MSK/SPINE:  No gross deformities, no edema SKIN:  no rash, atraumatic PSYCH:  Appropriate speech and behavior  *Additional and/or pertinent findings included in MDM below  Diagnostic and Interventional Summary    EKG Interpretation  Date/Time:    Ventricular Rate:    PR Interval:    QRS Duration:   QT Interval:    QTC Calculation:   R Axis:     Text Interpretation:        Labs Reviewed - No data to display  No orders to display    Medications  diphenhydrAMINE (BENADRYL) injection 25 mg (25 mg Intravenous Given 02/17/20 0804)  ketorolac (TORADOL) 15 MG/ML injection 15 mg (15 mg Intravenous Given 02/17/20 0802)  prochlorperazine (COMPAZINE) injection 10 mg (10 mg  Intravenous Given 02/17/20 0800)  sodium chloride 0.9 % bolus 1,000 mL (1,000 mLs Intravenous New Bag/Given 02/17/20 0800)     Procedures  /  Critical Care Procedures  ED Course and Medical Decision Making  I have reviewed the triage vital signs, the nursing notes, and pertinent available records from the EMR.  Listed above are laboratory and imaging tests that I personally ordered, reviewed, and interpreted and then considered in my medical decision making (see below for details).      Suspect migraine, reassuring neurological exam, gradual onset, doubt subarachnoid hemorrhage, no meningismus, will reassess after migraine cocktail.  Patient has complete resolution of symptoms after migraine cocktail.  Continued reassuring exam.  He also mentions a bug bite that he sustained 3 days ago with surrounding redness to his right abdomen.  Does not appear obviously infected, no fluctuance.  Advised Benadryl, warm compresses.  Provided prescription for antibiotics if worsening redness.  Elmer Sow. Pilar Plate, MD [facility] Emergency Medicine [facility] [email or web address]  Final Clinical Impressions(s) / ED Diagnoses     ICD-10-CM   1. Migraine without status migrainosus, not intractable, unspecified migraine type  G43.909     ED Discharge Orders         Ordered    doxycycline (VIBRAMYCIN) 100 MG capsule  2 times daily     Discontinue  Reprint     02/17/20 0952           Discharge Instructions Discussed with and Provided to Patient:     Discharge Instructions     You were evaluated in the Emergency Department and after careful evaluation, we did not find any emergent condition requiring admission or further testing in the hospital.  Your exam/testing today was overall reassuring.  We encourage you to follow-up with a primary care doctor to discuss your headaches, they may be able to start you on a preventative medication.  Regarding your bug bite, we recommend  over-the-counter Benadryl cream and warm compresses to the area over the next few days.  If the redness surrounding the area worsens over the next 2 or 3 days, you can fill the prescription for the antibiotic and take as directed.  Please return to the Emergency Department if you experience any worsening of your condition.  Thank you for allowing [place] to be a part of your care.        Sabas Sous, MD 02/17/20 [phone]

## 2020-02-17 NOTE — ED Triage Notes (Signed)
Patient arrived via POV c/o migraine x 1 week intermittently. Patient states 10/10 pain with it centered behind right eye. Patient states no relief with any otc medication. Patient os AO x 4, VS WDL, normal gait.

## 2021-05-08 ENCOUNTER — Emergency Department (HOSPITAL_BASED_OUTPATIENT_CLINIC_OR_DEPARTMENT_OTHER): Payer: Self-pay

## 2021-05-08 ENCOUNTER — Emergency Department (HOSPITAL_BASED_OUTPATIENT_CLINIC_OR_DEPARTMENT_OTHER)
Admission: EM | Admit: 2021-05-08 | Discharge: 2021-05-08 | Disposition: A | Discharge status: Home / Self Care | Payer: Self-pay | Attending: Emergency Medicine | Admitting: Emergency Medicine

## 2021-05-08 ENCOUNTER — Encounter (HOSPITAL_BASED_OUTPATIENT_CLINIC_OR_DEPARTMENT_OTHER): Payer: Self-pay | Admitting: *Deleted

## 2021-05-08 ENCOUNTER — Other Ambulatory Visit: Payer: Self-pay

## 2021-05-08 DIAGNOSIS — N200 Calculus of kidney: Secondary | ICD-10-CM

## 2021-05-08 DIAGNOSIS — K429 Umbilical hernia without obstruction or gangrene: Secondary | ICD-10-CM | POA: Insufficient documentation

## 2021-05-08 DIAGNOSIS — N201 Calculus of ureter: Secondary | ICD-10-CM | POA: Insufficient documentation

## 2021-05-08 DIAGNOSIS — N1330 Unspecified hydronephrosis: Secondary | ICD-10-CM | POA: Insufficient documentation

## 2021-05-08 LAB — CBC WITH DIFFERENTIAL/PLATELET
Abs Immature Granulocytes: 0.03 10*3/uL (ref 0.00–0.07)
Basophils Absolute: 0 10*3/uL (ref 0.0–0.1)
Basophils Relative: 0 %
Eosinophils Absolute: 0.2 10*3/uL (ref 0.0–0.5)
Eosinophils Relative: 2 %
HCT: 44.3 % (ref 39.0–52.0)
Hemoglobin: 14.9 g/dL (ref 13.0–17.0)
Immature Granulocytes: 0 %
Lymphocytes Relative: 28 %
Lymphs Abs: 2.7 10*3/uL (ref 0.7–4.0)
MCH: 30.3 pg (ref 26.0–34.0)
MCHC: 33.6 g/dL (ref 30.0–36.0)
MCV: 90 fL (ref 80.0–100.0)
Monocytes Absolute: 0.8 10*3/uL (ref 0.1–1.0)
Monocytes Relative: 9 %
Neutro Abs: 5.9 10*3/uL (ref 1.7–7.7)
Neutrophils Relative %: 61 %
Platelets: 222 10*3/uL (ref 150–400)
RBC: 4.92 MIL/uL (ref 4.22–5.81)
RDW: 13.5 % (ref 11.5–15.5)
WBC: 9.7 10*3/uL (ref 4.0–10.5)
nRBC: 0 % (ref 0.0–0.2)

## 2021-05-08 LAB — URINALYSIS, ROUTINE W REFLEX MICROSCOPIC
Bilirubin Urine: NEGATIVE
Glucose, UA: NEGATIVE mg/dL
Ketones, ur: NEGATIVE mg/dL
Leukocytes,Ua: NEGATIVE
Nitrite: NEGATIVE
Protein, ur: NEGATIVE mg/dL
Specific Gravity, Urine: 1.01 (ref 1.005–1.030)
pH: 6.5 (ref 5.0–8.0)

## 2021-05-08 LAB — COMPREHENSIVE METABOLIC PANEL
ALT: 47 U/L — ABNORMAL HIGH (ref 0–44)
AST: 33 U/L (ref 15–41)
Albumin: 4.4 g/dL (ref 3.5–5.0)
Alkaline Phosphatase: 61 U/L (ref 38–126)
Anion gap: 8 (ref 5–15)
BUN: 15 mg/dL (ref 6–20)
CO2: 26 mmol/L (ref 22–32)
Calcium: 9.4 mg/dL (ref 8.9–10.3)
Chloride: 104 mmol/L (ref 98–111)
Creatinine, Ser: 1.17 mg/dL (ref 0.61–1.24)
GFR, Estimated: >60 mL/min (ref >60)
Glucose, Bld: 92 mg/dL (ref 70–99)
Potassium: 4.1 mmol/L (ref 3.5–5.1)
Sodium: 138 mmol/L (ref 135–145)
Total Bilirubin: 0.6 mg/dL (ref 0.3–1.2)
Total Protein: 7.8 g/dL (ref 6.5–8.1)

## 2021-05-08 LAB — URINALYSIS, MICROSCOPIC (REFLEX): RBC / HPF: >50 RBC/hpf (ref 0–5)

## 2021-05-08 LAB — LIPASE, BLOOD: Lipase: 27 U/L (ref 11–51)

## 2021-05-08 MED ORDER — ONDANSETRON 4 MG PO TBDP: 4.0000 mg | ORAL_TABLET | Freq: Three times a day (TID) | ORAL | 0 refills | Status: DC | PRN

## 2021-05-08 MED ORDER — KETOROLAC TROMETHAMINE 30 MG/ML IJ SOLN
30.0000 mg | Freq: Once | INTRAMUSCULAR | Status: AC
  Administered 2021-05-08: 30 mg via INTRAVENOUS
  Filled 2021-05-08: qty 1

## 2021-05-08 MED ORDER — ONDANSETRON HCL 4 MG/2ML IJ SOLN
4.0000 mg | Freq: Once | INTRAMUSCULAR | Status: AC
  Administered 2021-05-08: 4 mg via INTRAVENOUS
  Filled 2021-05-08: qty 2

## 2021-05-08 MED ORDER — FENTANYL CITRATE PF 50 MCG/ML IJ SOSY
100.0000 ug | PREFILLED_SYRINGE | Freq: Once | INTRAMUSCULAR | Status: AC
  Administered 2021-05-08: 100 ug via INTRAVENOUS
  Filled 2021-05-08: qty 2

## 2021-05-08 MED ORDER — TAMSULOSIN HCL 0.4 MG PO CAPS: 0.4000 mg | ORAL_CAPSULE | Freq: Every day | ORAL | 0 refills | Status: AC

## 2021-05-08 NOTE — ED Notes (Signed)
ED Provider at bedside. 

## 2021-05-08 NOTE — ED Notes (Signed)
Pt transported to CT ?

## 2021-05-08 NOTE — ED Provider Notes (Signed)
[facility] EMERGENCY DEPARTMENT Provider Note   CSN: [id number] Arrival date & time: 05/08/21  4920     History Chief Complaint  Patient presents with   Abdominal Pain    Ryan Powers is a 35 y.o. male.  HPI     35 year old male with no significant medical history presents with concern for right-sided abdominal pain.  Reports that yesterday he began to have lower abdominal pain, nausea and decreased appetite.  Reports that the pain is constant, but it waxes and wanes and is very sharp, 10 out of 10 at its highest severity.  Reports it seems to be better when he begins walking around.  He had 3 episodes of vomiting.  Continues nausea.  No fever.  Has not had dysuria, but noted dark almost brown-colored urine concern for blood in his urine.  Also reports some radiation of pain into his groin, feeling that his testicles are being pulled upwards.  No history of abdominal surgery, no history of nephrolithiasis.  History reviewed. No pertinent past medical history.  There are no problems to display for this patient.   Past Surgical History:  Procedure Laterality Date   WISDOM TOOTH EXTRACTION         History reviewed. No pertinent family history.  Social History   Tobacco Use   Smoking status: Former    Packs/day: 0.50    Types: Cigarettes    Quit date: 02/17/2019    Years since quitting: 2.2   Smokeless tobacco: Never  Vaping Use   Vaping Use: Never used  Substance Use Topics   Alcohol use: Yes    Comment: weekends   Drug use: No    Home Medications Prior to Admission medications   Medication Sig Start Date End Date Taking? Authorizing Provider  ondansetron (ZOFRAN ODT) 4 MG disintegrating tablet Take 1 tablet (4 mg total) by mouth every 8 (eight) hours as needed for nausea or vomiting. 05/08/21  Yes Alvira Monday, MD  tamsulosin (FLOMAX) 0.4 MG CAPS capsule Take 1 capsule (0.4 mg total) by mouth daily for 14 days. 05/08/21 05/22/21 Yes Alvira Monday, MD  sildenafil (REVATIO) 20 MG tablet SMARTSIG:3 Tablet(s) By Mouth PRN 01/31/20   [provider]    Allergies    Percocet [oxycodone-acetaminophen]  Review of Systems   Review of Systems  Constitutional:  Positive for appetite change. Negative for fever.  Respiratory:  Negative for cough and shortness of breath.   Cardiovascular:  Negative for chest pain.  Gastrointestinal:  Positive for abdominal pain, nausea and vomiting. Negative for constipation and diarrhea.  Genitourinary:  Positive for hematuria. Negative for dysuria and frequency.  Musculoskeletal:  Positive for back pain (last week).  Skin:  Negative for rash.  Neurological:  Negative for headaches.   Physical Exam Updated Vital Signs BP 118/74   Pulse (!) 57   Temp 98.2 F (36.8 C) (Oral)   Resp 12   Ht 5\' 9"  (1.753 m)   Wt 97.5 kg   SpO2 97%   BMI 31.75 kg/m   Physical Exam Vitals and nursing note reviewed.  Constitutional:      General: He is in acute distress.     Appearance: He is well-developed. He is not diaphoretic.  HENT:     Head: Normocephalic and atraumatic.  Eyes:     Conjunctiva/sclera: Conjunctivae normal.  Cardiovascular:     Rate and Rhythm: Normal rate and regular rhythm.     Heart sounds: Normal heart sounds. No murmur heard.  No friction rub. No gallop.  Pulmonary:     Effort: Pulmonary effort is normal. No respiratory distress.     Breath sounds: Normal breath sounds. No wheezing or rales.  Abdominal:     General: There is no distension.     Palpations: Abdomen is soft.     Tenderness: There is abdominal tenderness. There is right CVA tenderness and guarding. Positive signs include McBurney's sign.  Musculoskeletal:     Cervical back: Normal range of motion.  Skin:    General: Skin is warm and dry.  Neurological:     Mental Status: He is alert and oriented to person, place, and time.    ED Results / Procedures / Treatments   Labs (all labs ordered are listed,  but only abnormal results are displayed) Labs Reviewed  COMPREHENSIVE METABOLIC PANEL - Abnormal; Notable for the following components:      Result Value   ALT 47 (*)    All other components within normal limits  URINALYSIS, ROUTINE W REFLEX MICROSCOPIC - Abnormal; Notable for the following components:   Color, Urine PINK (*)    APPearance CLOUDY (*)    Hgb urine dipstick LARGE (*)    All other components within normal limits  URINALYSIS, MICROSCOPIC (REFLEX) - Abnormal; Notable for the following components:   Bacteria, UA FEW (*)    All other components within normal limits  URINE CULTURE  CBC WITH DIFFERENTIAL/PLATELET  LIPASE, BLOOD    EKG None  Radiology CT Renal Stone Study  Result Date: 05/08/2021 CLINICAL DATA:  RIGHT-side flank pain, question kidney stone versus appendicitis, onset of abdominal pain yesterday, nausea, poor appetite EXAM: CT ABDOMEN AND PELVIS WITHOUT CONTRAST TECHNIQUE: Multidetector CT imaging of the abdomen and pelvis was performed following the standard protocol without IV contrast. Sagittal and coronal MPR images reconstructed from axial data set. No oral contrast administered. COMPARISON:  None FINDINGS: Lower chest: Lung bases clear Hepatobiliary: Gallbladder and liver normal appearance Pancreas: Normal appearance Spleen: Normal appearance Adrenals/Urinary Tract: Adrenal glands and LEFT kidney normal appearance. RIGHT hydronephrosis and hydroureter secondary to a 3 mm mid RIGHT ureteral calculus image 60. Minimal RIGHT peripelvic and peri-ureteral edema. Slight enlargement of RIGHT kidney. LEFT ureter and bladder unremarkable. Stomach/Bowel: Normal appendix. Stomach and bowel loops unremarkable. Vascular/Lymphatic: Aorta normal caliber. Normal sized inguinal lymph nodes. No definite abdominal or pelvic adenopathy. Reproductive: Unremarkable seminal vesicles. Prostate gland measures 4.1 x 3.7 x 2.7 cm (volume = 21 cm^3). Other: No free air or free fluid. Tiny  umbilical hernia containing fat. No additional inflammatory process. Musculoskeletal: Osseous structures unremarkable. IMPRESSION: Mild RIGHT hydronephrosis and proximal hydroureter secondary to a 3 mm mid RIGHT ureteral calculus. Electronically Signed   By: Ulyses Southward M.D.   On: 05/08/2021 09:03    Procedures Procedures   Medications Ordered in ED Medications  fentaNYL (SUBLIMAZE) injection 100 mcg (100 mcg Intravenous Given 05/08/21 0851)  ondansetron (ZOFRAN) injection 4 mg (4 mg Intravenous Given 05/08/21 0851)  ketorolac (TORADOL) 30 MG/ML injection 30 mg (30 mg Intravenous Given 05/08/21 0932)    ED Course  I have reviewed the triage vital signs and the nursing notes.  Pertinent labs & imaging results that were available during my care of the patient were reviewed by me and considered in my medical decision making (see chart for details).    MDM Rules/Calculators/A&P  35 year old male with no significant medical history presents with concern for right-sided abdominal pain.  DDx includes appendicitis, pancreatitis, cholecystitis, pyelonephritis, nephrolithiasis, diverticulitis, SBO. No sign of pancreatitis, hepatitis, or other clinically significant abnormalities on CMP.  CT stone study shows 98mm obstructing ureteral stone.  UA without infection.   Given IV fluids, pain medications, zofran with improvement.  Declines narcotic rx for pain. Given rx for flomax, zofran, recommend ibuprofen/tylenol.. Patient discharged in stable condition with understanding of reasons to return.    Final Clinical Impression(s) / ED Diagnoses Final diagnoses:  Nephrolithiasis    Rx / DC Orders ED Discharge Orders          Ordered    tamsulosin (FLOMAX) 0.4 MG CAPS capsule  Daily        05/08/21 0933    ondansetron (ZOFRAN ODT) 4 MG disintegrating tablet  Every 8 hours PRN        05/08/21 9030             Alvira Monday, MD 05/08/21 2203

## 2021-05-08 NOTE — ED Triage Notes (Signed)
Began having abd pain yesterday am, having nausea, poor appetite.

## 2021-05-08 NOTE — ED Notes (Signed)
NPO OF SOLIDS 2100HRS, LAST PM (05-07-21) SIP OF WATER THIS AM, BUT VOMITED

## 2021-05-09 LAB — URINE CULTURE: Culture: NO GROWTH

## 2023-02-11 ENCOUNTER — Emergency Department (HOSPITAL_BASED_OUTPATIENT_CLINIC_OR_DEPARTMENT_OTHER): Payer: BC Managed Care – PPO

## 2023-02-11 ENCOUNTER — Other Ambulatory Visit: Payer: Self-pay

## 2023-02-11 ENCOUNTER — Encounter (HOSPITAL_COMMUNITY): Payer: Self-pay | Admitting: Orthopedic Surgery

## 2023-02-11 ENCOUNTER — Inpatient Hospital Stay (HOSPITAL_BASED_OUTPATIENT_CLINIC_OR_DEPARTMENT_OTHER)
Admission: EM | Admit: 2023-02-11 | Discharge: 2023-02-15 | DRG: 488 | Disposition: A | Discharge status: Home / Self Care | Payer: BC Managed Care – PPO | Attending: Orthopedic Surgery | Admitting: Orthopedic Surgery

## 2023-02-11 DIAGNOSIS — S90811A Abrasion, right foot, initial encounter: Secondary | ICD-10-CM | POA: Diagnosis present

## 2023-02-11 DIAGNOSIS — Z885 Allergy status to narcotic agent status: Secondary | ICD-10-CM

## 2023-02-11 DIAGNOSIS — S82141A Displaced bicondylar fracture of right tibia, initial encounter for closed fracture: Secondary | ICD-10-CM | POA: Diagnosis present

## 2023-02-11 DIAGNOSIS — M25571 Pain in right ankle and joints of right foot: Secondary | ICD-10-CM | POA: Diagnosis present

## 2023-02-11 DIAGNOSIS — Z4789 Encounter for other orthopedic aftercare: Secondary | ICD-10-CM | POA: Diagnosis not present

## 2023-02-11 DIAGNOSIS — S83281A Other tear of lateral meniscus, current injury, right knee, initial encounter: Secondary | ICD-10-CM | POA: Diagnosis present

## 2023-02-11 DIAGNOSIS — D62 Acute posthemorrhagic anemia: Secondary | ICD-10-CM | POA: Diagnosis present

## 2023-02-11 DIAGNOSIS — S82831A Other fracture of upper and lower end of right fibula, initial encounter for closed fracture: Secondary | ICD-10-CM | POA: Diagnosis present

## 2023-02-11 DIAGNOSIS — M25561 Pain in right knee: Secondary | ICD-10-CM | POA: Diagnosis present

## 2023-02-11 DIAGNOSIS — M25531 Pain in right wrist: Secondary | ICD-10-CM | POA: Diagnosis present

## 2023-02-11 DIAGNOSIS — E559 Vitamin D deficiency, unspecified: Secondary | ICD-10-CM | POA: Insufficient documentation

## 2023-02-11 DIAGNOSIS — M25461 Effusion, right knee: Secondary | ICD-10-CM | POA: Diagnosis not present

## 2023-02-11 DIAGNOSIS — S82401A Unspecified fracture of shaft of right fibula, initial encounter for closed fracture: Secondary | ICD-10-CM | POA: Diagnosis not present

## 2023-02-11 DIAGNOSIS — Z87891 Personal history of nicotine dependence: Secondary | ICD-10-CM

## 2023-02-11 DIAGNOSIS — F32A Depression, unspecified: Secondary | ICD-10-CM | POA: Diagnosis present

## 2023-02-11 DIAGNOSIS — S82121A Displaced fracture of lateral condyle of right tibia, initial encounter for closed fracture: Secondary | ICD-10-CM | POA: Diagnosis not present

## 2023-02-11 DIAGNOSIS — S80212A Abrasion, left knee, initial encounter: Secondary | ICD-10-CM | POA: Diagnosis present

## 2023-02-11 DIAGNOSIS — Z886 Allergy status to analgesic agent status: Secondary | ICD-10-CM

## 2023-02-11 DIAGNOSIS — S0990XA Unspecified injury of head, initial encounter: Secondary | ICD-10-CM | POA: Diagnosis not present

## 2023-02-11 LAB — BASIC METABOLIC PANEL
Anion gap: 5 (ref 5–15)
BUN: 12 mg/dL (ref 6–20)
CO2: 20 mmol/L — ABNORMAL LOW (ref 22–32)
Calcium: 7.5 mg/dL — ABNORMAL LOW (ref 8.9–10.3)
Chloride: 111 mmol/L (ref 98–111)
Creatinine, Ser: 0.81 mg/dL (ref 0.61–1.24)
GFR, Estimated: >60 mL/min (ref >60)
Glucose, Bld: 86 mg/dL (ref 70–99)
Potassium: 3.1 mmol/L — ABNORMAL LOW (ref 3.5–5.1)
Sodium: 136 mmol/L (ref 135–145)

## 2023-02-11 LAB — SURGICAL PCR SCREEN
MRSA, PCR: NEGATIVE
Staphylococcus aureus: NEGATIVE

## 2023-02-11 LAB — CBC
HCT: 36.8 % — ABNORMAL LOW (ref 39.0–52.0)
Hemoglobin: 12.4 g/dL — ABNORMAL LOW (ref 13.0–17.0)
MCH: 29.5 pg (ref 26.0–34.0)
MCHC: 33.7 g/dL (ref 30.0–36.0)
MCV: 87.6 fL (ref 80.0–100.0)
Platelets: 202 10*3/uL (ref 150–400)
RBC: 4.2 MIL/uL — ABNORMAL LOW (ref 4.22–5.81)
RDW: 13.9 % (ref 11.5–15.5)
WBC: 11.6 10*3/uL — ABNORMAL HIGH (ref 4.0–10.5)
nRBC: 0 % (ref 0.0–0.2)

## 2023-02-11 MED ORDER — HYDROMORPHONE HCL 1 MG/ML IJ SOLN
0.5000 mg | INTRAMUSCULAR | Status: DC | PRN
  Administered 2023-02-11: 0.5 mg via INTRAVENOUS
  Filled 2023-02-11: qty 0.5

## 2023-02-11 MED ORDER — METHOCARBAMOL 500 MG PO TABS
500.0000 mg | ORAL_TABLET | Freq: Four times a day (QID) | ORAL | Status: DC | PRN
  Administered 2023-02-12: 500 mg via ORAL
  Filled 2023-02-11: qty 1

## 2023-02-11 MED ORDER — METHOCARBAMOL 1000 MG/10ML IJ SOLN
500.0000 mg | Freq: Four times a day (QID) | INTRAVENOUS | Status: DC | PRN
  Filled 2023-02-11: qty 5

## 2023-02-11 MED ORDER — ACETAMINOPHEN 500 MG PO TABS
1000.0000 mg | ORAL_TABLET | Freq: Three times a day (TID) | ORAL | Status: DC
  Administered 2023-02-11 – 2023-02-15 (×9): 1000 mg via ORAL
  Filled 2023-02-11 (×9): qty 2

## 2023-02-11 MED ORDER — POLYETHYLENE GLYCOL 3350 17 G PO PACK
17.0000 g | PACK | Freq: Every day | ORAL | Status: DC | PRN
  Administered 2023-02-12: 17 g via ORAL
  Filled 2023-02-11: qty 1

## 2023-02-11 MED ORDER — CYCLOBENZAPRINE HCL 10 MG PO TABS
10.0000 mg | ORAL_TABLET | Freq: Once | ORAL | Status: AC
  Administered 2023-02-11: 10 mg via ORAL
  Filled 2023-02-11: qty 1

## 2023-02-11 MED ORDER — POTASSIUM CHLORIDE CRYS ER 20 MEQ PO TBCR
40.0000 meq | EXTENDED_RELEASE_TABLET | Freq: Once | ORAL | Status: AC
  Administered 2023-02-11: 40 meq via ORAL
  Filled 2023-02-11: qty 2

## 2023-02-11 MED ORDER — HYDROMORPHONE HCL 2 MG PO TABS
2.0000 mg | ORAL_TABLET | ORAL | Status: DC | PRN
  Administered 2023-02-11 – 2023-02-12 (×5): 4 mg via ORAL
  Administered 2023-02-13: 2 mg via ORAL
  Administered 2023-02-13: 4 mg via ORAL
  Administered 2023-02-14 (×2): 2 mg via ORAL
  Administered 2023-02-14: 4 mg via ORAL
  Administered 2023-02-14: 2 mg via ORAL
  Administered 2023-02-14 – 2023-02-15 (×3): 4 mg via ORAL
  Filled 2023-02-11 (×2): qty 2
  Filled 2023-02-11 (×2): qty 1
  Filled 2023-02-11 (×4): qty 2
  Filled 2023-02-11: qty 1
  Filled 2023-02-11 (×2): qty 2
  Filled 2023-02-11: qty 1
  Filled 2023-02-11 (×2): qty 2

## 2023-02-11 MED ORDER — ACETAMINOPHEN 500 MG PO TABS
1000.0000 mg | ORAL_TABLET | Freq: Once | ORAL | Status: DC
  Filled 2023-02-11: qty 2

## 2023-02-11 MED ORDER — FENTANYL CITRATE PF 50 MCG/ML IJ SOSY
75.0000 ug | PREFILLED_SYRINGE | Freq: Once | INTRAMUSCULAR | Status: AC
  Administered 2023-02-11: 75 ug via INTRAVENOUS
  Filled 2023-02-11: qty 2

## 2023-02-11 MED ORDER — HYDROMORPHONE HCL 1 MG/ML IJ SOLN: 0.5000 mg | INTRAMUSCULAR | Status: DC | PRN

## 2023-02-11 MED ORDER — ONDANSETRON HCL 4 MG/2ML IJ SOLN: 4.0000 mg | Freq: Three times a day (TID) | INTRAMUSCULAR | Status: DC | PRN

## 2023-02-11 MED ORDER — OXYCODONE HCL 5 MG PO TABS: 5.0000 mg | ORAL_TABLET | ORAL | Status: DC | PRN

## 2023-02-11 MED ORDER — FENTANYL CITRATE PF 50 MCG/ML IJ SOSY
50.0000 ug | PREFILLED_SYRINGE | Freq: Once | INTRAMUSCULAR | Status: AC
  Administered 2023-02-11: 50 ug via INTRAVENOUS
  Filled 2023-02-11: qty 1

## 2023-02-11 NOTE — ED Notes (Signed)
Pt in radiology 
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
## 2023-02-11 NOTE — ED Notes (Signed)
Ice bag applied in triage
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
## 2023-02-11 NOTE — Discharge Instructions (Addendum)
Orthopaedic Trauma Service Discharge Instructions   General Discharge Instructions  Orthopaedic Injuries:  Right tibial plateau fracture treated with open reduction internal fixation using plate and screws  WEIGHT BEARING STATUS: Nonweightbearing right leg for 6 weeks.  Graduated weightbearing thereafter.  Use walker or crutches to mobilize  RANGE OF MOTION/ACTIVITY: Unrestricted range of motion of right knee.  Activity as tolerated while maintaining weightbearing restrictions.  Please do home exercises that were taught to you by therapy at least twice a day.  do not let knee rest in flexion.  Use bone foam or pillow under the ankle to help your knee remains straight and not working on range of motion  Bone health: Labs show some mild vitamin D insufficiency.  Please take vitamin D supplements that have been prescribed  Review the following resource for additional information regarding bone health  [email or web address][employer]cy  Wound Care: Daily wound care starting on 02/18/2023.  Please see below.  Once there is no drainage you can leave the incisions open to the air and clean with soap and water only.  Sutures will be removed in about 2 weeks at your first postoperative appointment.  Continue to use Ace wrap to help with swelling control.  Alternatively you can use a compression sock.  Knee-high compression sock, You can use a thigh-high if desired   Discharge Wound Care Instructions  Do NOT apply any ointments, solutions or lotions to pin sites or surgical wounds.  These prevent needed drainage and even though solutions like hydrogen peroxide kill bacteria, they also damage cells lining the pin sites that help fight infection.  Applying lotions or ointments can keep the wounds moist and can cause them to breakdown and open up as well. This can increase the risk for infection. When in doubt call the office.  Surgical incisions should be dressed daily.  If any drainage is  noted, use one layer of adaptic or Mepitel, then gauze, Kerlix, and an ace wrap.  Net[employer]per[employer]cz [email or web address]_[email or web address]&[email or web address]  [email or web address]  These dressing supplies should be available at local medical supply stores ([facility], [facility], etc). They are not usually carried at places like [facility], [facility], [facility], etc  Once the incision is completely dry and without drainage, it may be left open to air out.  Showering may begin 36-48 hours later.  Cleaning gently with soap and water.   DVT/PE prophylaxis: Eliquis 2.5 mg every 12 hours for 30 days  Diet: as you were eating previously.  Can use over the counter stool softeners and bowel preparations, such as Miralax, to help with bowel movements.  Narcotics can be constipating.  Be sure to drink plenty of fluids  PAIN MEDICATION USE AND EXPECTATIONS  You have likely been given narcotic medications to help control your pain.  After a traumatic event that results in an fracture (broken bone) with or without surgery, it is ok to use narcotic pain medications to help control one's pain.  We understand that everyone responds to pain differently and each individual patient will be evaluated on a regular basis for the continued need for narcotic medications. Ideally, narcotic medication use should last no more than 6-8 weeks (coinciding with fracture healing).   As a patient it is your responsibility as well to monitor narcotic medication use and report the amount and frequency you use these medications when you come to your office visit.   We would also advise that if you are using narcotic medications, you should take a dose prior to therapy to  maximize you participation.  IF YOU ARE ON NARCOTIC MEDICATIONS IT IS NOT PERMISSIBLE  TO OPERATE A MOTOR VEHICLE (MOTORCYCLE/CAR/TRUCK/MOPED) OR HEAVY MACHINERY DO NOT MIX NARCOTICS WITH OTHER CNS (CENTRAL NERVOUS SYSTEM) DEPRESSANTS SUCH AS ALCOHOL   POST-OPERATIVE OPIOID TAPER INSTRUCTIONS: It is important to wean off of your opioid medication as soon as possible. If you do not need pain medication after your surgery it is ok to stop day one. Opioids include: Codeine, Hydrocodone(Norco, Vicodin), Oxycodone(Percocet, oxycontin) and hydromorphone amongst others.  Long term and even short term use of opiods can cause: Increased pain response Dependence Constipation Depression Respiratory depression And more.  Withdrawal symptoms can include Flu like symptoms Nausea, vomiting And more Techniques to manage these symptoms Hydrate well Eat regular healthy meals Stay active Use relaxation techniques(deep breathing, meditating, yoga) Do Not substitute Alcohol to help with tapering If you have been on opioids for less than two weeks and do not have pain than it is ok to stop all together.  Plan to wean off of opioids This plan should start within one week post op of your fracture surgery  Maintain the same interval or time between taking each dose and first decrease the dose.  Cut the total daily intake of opioids by one tablet each day Next start to increase the time between doses. The last dose that should be eliminated is the evening dose.    STOP SMOKING OR USING NICOTINE PRODUCTS!!!!  As discussed nicotine severely impairs your body's ability to heal surgical and traumatic wounds but also impairs bone healing.  Wounds and bone heal by forming microscopic blood vessels (angiogenesis) and nicotine is a vasoconstrictor (essentially, shrinks blood vessels).  Therefore, if vasoconstriction occurs to these microscopic blood vessels they essentially disappear and are unable to deliver necessary nutrients to the healing tissue.  This is one modifiable factor that you can do to  dramatically increase your chances of healing your injury.    (This means no smoking, no nicotine gum, patches, etc)  DO NOT USE NONSTEROIDAL ANTI-INFLAMMATORY DRUGS (NSAID'S)  Using products such as Advil (ibuprofen), Aleve (naproxen), Motrin (ibuprofen) for additional pain control during fracture healing can delay and/or prevent the healing response.  If you would like to take over the counter (OTC) medication, Tylenol (acetaminophen) is ok.  However, some narcotic medications that are given for pain control contain acetaminophen as well. Therefore, you should not exceed more than 4000 mg of tylenol in a day if you do not have liver disease.  Also note that there are may OTC medicines, such as cold medicines and allergy medicines that my contain tylenol as well.  If you have any questions about medications and/or interactions please ask your doctor/PA or your pharmacist.      ICE AND ELEVATE INJURED/OPERATIVE EXTREMITY  Using ice and elevating the injured extremity above your heart can help with swelling and pain control.  Icing in a pulsatile fashion, such as 20 minutes on and 20 minutes off, can be followed.    Do not place ice directly on skin. Make sure there is a barrier between to skin and the ice pack.    Using frozen items such as frozen peas works well as the conform nicely to the are that needs to be iced.  USE AN ACE WRAP OR TED HOSE FOR SWELLING CONTROL  In addition to icing and elevation, Ace wraps or TED hose are used to help limit and resolve swelling.  It is recommended to use Ace wraps  or TED hose until you are informed to stop.    When using Ace Wraps start the wrapping distally (farthest away from the body) and wrap proximally (closer to the body)   Example: If you had surgery on your leg or thing and you do not have a splint on, start the ace wrap at the toes and work your way up to the thigh        If you had surgery on your upper extremity and do not have a splint on, start  the ace wrap at your fingers and work your way up to the upper arm  IF YOU ARE IN A SPLINT OR CAST DO NOT REMOVE IT FOR ANY REASON   If your splint gets wet for any reason please contact the office immediately. You may shower in your splint or cast as long as you keep it dry.  This can be done by wrapping in a cast cover or garbage back (or similar)  Do Not stick any thing down your splint or cast such as pencils, money, or hangers to try and scratch yourself with.  If you feel itchy take benadryl as prescribed on the bottle for itching  IF YOU ARE IN A CAM BOOT (BLACK BOOT)  You may remove boot periodically. Perform daily dressing changes as noted below.  Wash the liner of the boot regularly and wear a sock when wearing the boot. It is recommended that you sleep in the boot until told otherwise    Call office for the following: Temperature greater than 101F Persistent nausea and vomiting Severe uncontrolled pain Redness, tenderness, or signs of infection (pain, swelling, redness, odor or green/yellow discharge around the site) Difficulty breathing, headache or visual disturbances Hives Persistent dizziness or light-headedness Extreme fatigue Any other questions or concerns you may have after discharge  In an emergency, call 911 or go to an Emergency Department at a nearby hospital  HELPFUL INFORMATION  If you had a block, it will wear off between 8-24 hrs postop typically.  This is period when your pain may go from nearly zero to the pain you would have had postop without the block.  This is an abrupt transition but nothing dangerous is happening.  You may take an extra dose of narcotic when this happens.  You should wean off your narcotic medicines as soon as you are able.  Most patients will be off or using minimal narcotics before their first postop appointment.   We suggest you use the pain medication the first night prior to going to bed, in order to ease any pain when the  anesthesia wears off. You should avoid taking pain medications on an empty stomach as it will make you nauseous.  Do not drink alcoholic beverages or take illicit drugs when taking pain medications.  In most states it is against the law to drive while you are in a splint or sling.  And certainly against the law to drive while taking narcotics.  You may return to work/school in the next couple of days when you feel up to it.   Pain medication may make you constipated.  Below are a few solutions to try in this order: Decrease the amount of pain medication if you aren't having pain. Drink lots of decaffeinated fluids. Drink prune juice and/or each dried prunes  If the first 3 don't work start with additional solutions Take Colace - an over-the-counter stool softener Take Senokot - an over-the-counter laxative Take Miralax - a  stronger over-the-counter laxative     CALL THE OFFICE WITH ANY QUESTIONS OR CONCERNS: [phone]   VISIT OUR WEBSITE FOR ADDITIONAL INFORMATION: [email or web address]

## 2023-02-12 ENCOUNTER — Observation Stay (HOSPITAL_COMMUNITY): Payer: BC Managed Care – PPO

## 2023-02-12 ENCOUNTER — Encounter (HOSPITAL_COMMUNITY): Payer: Self-pay | Admitting: Orthopedic Surgery

## 2023-02-12 DIAGNOSIS — M25561 Pain in right knee: Secondary | ICD-10-CM | POA: Diagnosis present

## 2023-02-12 DIAGNOSIS — Z885 Allergy status to narcotic agent status: Secondary | ICD-10-CM | POA: Diagnosis not present

## 2023-02-12 DIAGNOSIS — D62 Acute posthemorrhagic anemia: Secondary | ICD-10-CM | POA: Diagnosis present

## 2023-02-12 DIAGNOSIS — M25531 Pain in right wrist: Secondary | ICD-10-CM | POA: Diagnosis not present

## 2023-02-12 DIAGNOSIS — S82141A Displaced bicondylar fracture of right tibia, initial encounter for closed fracture: Secondary | ICD-10-CM | POA: Diagnosis present

## 2023-02-12 DIAGNOSIS — M25571 Pain in right ankle and joints of right foot: Secondary | ICD-10-CM | POA: Diagnosis present

## 2023-02-12 DIAGNOSIS — Z87891 Personal history of nicotine dependence: Secondary | ICD-10-CM | POA: Diagnosis not present

## 2023-02-12 DIAGNOSIS — E559 Vitamin D deficiency, unspecified: Secondary | ICD-10-CM | POA: Diagnosis present

## 2023-02-12 DIAGNOSIS — F32A Depression, unspecified: Secondary | ICD-10-CM | POA: Diagnosis present

## 2023-02-12 DIAGNOSIS — Z886 Allergy status to analgesic agent status: Secondary | ICD-10-CM | POA: Diagnosis not present

## 2023-02-12 DIAGNOSIS — S80212A Abrasion, left knee, initial encounter: Secondary | ICD-10-CM | POA: Diagnosis present

## 2023-02-12 DIAGNOSIS — S90811A Abrasion, right foot, initial encounter: Secondary | ICD-10-CM | POA: Diagnosis present

## 2023-02-12 DIAGNOSIS — S83281A Other tear of lateral meniscus, current injury, right knee, initial encounter: Secondary | ICD-10-CM | POA: Diagnosis present

## 2023-02-12 DIAGNOSIS — S82831A Other fracture of upper and lower end of right fibula, initial encounter for closed fracture: Secondary | ICD-10-CM | POA: Diagnosis present

## 2023-02-12 LAB — COMPREHENSIVE METABOLIC PANEL
ALT: 35 U/L (ref 0–44)
AST: 26 U/L (ref 15–41)
Albumin: 3.7 g/dL (ref 3.5–5.0)
Alkaline Phosphatase: 56 U/L (ref 38–126)
Anion gap: 8 (ref 5–15)
BUN: 12 mg/dL (ref 6–20)
CO2: 22 mmol/L (ref 22–32)
Calcium: 8.6 mg/dL — ABNORMAL LOW (ref 8.9–10.3)
Chloride: 106 mmol/L (ref 98–111)
Creatinine, Ser: 1.11 mg/dL (ref 0.61–1.24)
GFR, Estimated: >60 mL/min (ref >60)
Glucose, Bld: 104 mg/dL — ABNORMAL HIGH (ref 70–99)
Potassium: 3.5 mmol/L (ref 3.5–5.1)
Sodium: 136 mmol/L (ref 135–145)
Total Bilirubin: 0.9 mg/dL (ref 0.3–1.2)
Total Protein: 6.9 g/dL (ref 6.5–8.1)

## 2023-02-12 LAB — MAGNESIUM: Magnesium: 2.2 mg/dL (ref 1.7–2.4)

## 2023-02-12 MED ORDER — CEFAZOLIN SODIUM-DEXTROSE 2-4 GM/100ML-% IV SOLN
2.0000 g | Freq: Once | INTRAVENOUS | Status: AC
  Administered 2023-02-13: 2 g via INTRAVENOUS
  Filled 2023-02-12: qty 100

## 2023-02-12 NOTE — Plan of Care (Signed)
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
## 2023-02-12 NOTE — Progress Notes (Signed)
Physical Therapy  Note  Patient Details Name: Ryan Powers MRN: [id number] DOB: 1985/12/23   Cancelled Treatment:    Reason Eval/Treat Not Completed: Other (comment) Will plan to see for PT postop;  Communicated with bedside Nurse and Ortho/Trauma;   Van Clines, PT  Acute Rehabilitation Services Office [phone] Secure Chat welcomed    Levi Aland 02/12/2023, 10:04 AM
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
## 2023-02-12 NOTE — Progress Notes (Signed)
Orthopedic Tech Progress Note Patient Details:  Ryan Powers Mar 19, 1986 [id number]  Called in order to [employer] for  a ROM BRACE UNLOCKED   Ortho Devices Type of Ortho Device: Ace wrap, Cotton web roll Ortho Device/Splint Location: RLE Ortho Device/Splint Interventions: Ordered, Application, Adjustment   Post Interventions Patient Tolerated: Well Instructions Provided: Care of device  [name] 02/12/2023, 10:36 AM

## 2023-02-12 NOTE — Plan of Care (Signed)

## 2023-02-13 ENCOUNTER — Encounter (HOSPITAL_COMMUNITY): Payer: Self-pay | Admitting: Orthopedic Surgery

## 2023-02-13 ENCOUNTER — Encounter (HOSPITAL_COMMUNITY)
Admission: EM | Disposition: A | Discharge status: Home / Self Care | Payer: Self-pay | Source: Home / Self Care | Attending: Orthopedic Surgery

## 2023-02-13 ENCOUNTER — Other Ambulatory Visit: Payer: Self-pay

## 2023-02-13 ENCOUNTER — Inpatient Hospital Stay (HOSPITAL_COMMUNITY): Payer: BC Managed Care – PPO

## 2023-02-13 ENCOUNTER — Inpatient Hospital Stay (HOSPITAL_COMMUNITY): Payer: BC Managed Care – PPO | Admitting: Anesthesiology

## 2023-02-13 HISTORY — PX: ORIF TIBIA PLATEAU: SHX2132

## 2023-02-13 LAB — CBC
HCT: 39.5 % (ref 39.0–52.0)
Hemoglobin: 12.9 g/dL — ABNORMAL LOW (ref 13.0–17.0)
MCH: 29.3 pg (ref 26.0–34.0)
MCHC: 32.7 g/dL (ref 30.0–36.0)
MCV: 89.6 fL (ref 80.0–100.0)
Platelets: 202 10*3/uL (ref 150–400)
RBC: 4.41 MIL/uL (ref 4.22–5.81)
RDW: 13.8 % (ref 11.5–15.5)
WBC: 11.4 10*3/uL — ABNORMAL HIGH (ref 4.0–10.5)
nRBC: 0 % (ref 0.0–0.2)

## 2023-02-13 LAB — VITAMIN D 25 HYDROXY (VIT D DEFICIENCY, FRACTURES): Vit D, 25-Hydroxy: 29.66 ng/mL — ABNORMAL LOW (ref 30–100)

## 2023-02-13 SURGERY — OPEN REDUCTION INTERNAL FIXATION (ORIF) TIBIAL PLATEAU
Anesthesia: General | Site: Knee | Laterality: Right

## 2023-02-13 MED ORDER — PHENYLEPHRINE 80 MCG/ML (10ML) SYRINGE FOR IV PUSH (FOR BLOOD PRESSURE SUPPORT)
PREFILLED_SYRINGE | INTRAVENOUS | Status: DC | PRN
  Administered 2023-02-13: 80 ug via INTRAVENOUS

## 2023-02-13 MED ORDER — PROPOFOL 10 MG/ML IV BOLUS
INTRAVENOUS | Status: DC | PRN
Start: 2023-02-13 — End: 2023-02-13
  Administered 2023-02-13: 200 mg via INTRAVENOUS

## 2023-02-13 MED ORDER — CEFAZOLIN SODIUM-DEXTROSE 2-4 GM/100ML-% IV SOLN
2.0000 g | Freq: Three times a day (TID) | INTRAVENOUS | Status: AC
  Administered 2023-02-13 – 2023-02-14 (×3): 2 g via INTRAVENOUS
  Filled 2023-02-13 (×3): qty 100

## 2023-02-13 MED ORDER — DEXAMETHASONE SODIUM PHOSPHATE 10 MG/ML IJ SOLN
INTRAMUSCULAR | Status: AC
  Filled 2023-02-13: qty 1

## 2023-02-13 MED ORDER — GLYCOPYRROLATE PF 0.2 MG/ML IJ SOSY
PREFILLED_SYRINGE | INTRAMUSCULAR | Status: AC
  Filled 2023-02-13: qty 1

## 2023-02-13 MED ORDER — PROPOFOL 10 MG/ML IV BOLUS
INTRAVENOUS | Status: AC
  Filled 2023-02-13: qty 20

## 2023-02-13 MED ORDER — MIDAZOLAM HCL 2 MG/2ML IJ SOLN
INTRAMUSCULAR | Status: DC | PRN
  Administered 2023-02-13: 2 mg via INTRAVENOUS

## 2023-02-13 MED ORDER — ROCURONIUM BROMIDE 10 MG/ML (PF) SYRINGE
PREFILLED_SYRINGE | INTRAVENOUS | Status: DC | PRN
  Administered 2023-02-13 (×2): 20 mg via INTRAVENOUS
  Administered 2023-02-13: 60 mg via INTRAVENOUS

## 2023-02-13 MED ORDER — ACETAMINOPHEN 10 MG/ML IV SOLN: 1000.0000 mg | Freq: Once | INTRAVENOUS | Status: DC | PRN

## 2023-02-13 MED ORDER — LIDOCAINE 2% (20 MG/ML) 5 ML SYRINGE
INTRAMUSCULAR | Status: DC | PRN
  Administered 2023-02-13: 40 mg via INTRAVENOUS

## 2023-02-13 MED ORDER — FENTANYL CITRATE (PF) 250 MCG/5ML IJ SOLN
INTRAMUSCULAR | Status: AC
  Filled 2023-02-13: qty 5

## 2023-02-13 MED ORDER — AMISULPRIDE (ANTIEMETIC) 5 MG/2ML IV SOLN: 10.0000 mg | Freq: Once | INTRAVENOUS | Status: DC | PRN

## 2023-02-13 MED ORDER — ROCURONIUM BROMIDE 10 MG/ML (PF) SYRINGE
PREFILLED_SYRINGE | INTRAVENOUS | Status: AC
  Filled 2023-02-13: qty 10

## 2023-02-13 MED ORDER — ONDANSETRON HCL 4 MG/2ML IJ SOLN
INTRAMUSCULAR | Status: DC | PRN
Start: 2023-02-13 — End: 2023-02-13
  Administered 2023-02-13: 4 mg via INTRAVENOUS

## 2023-02-13 MED ORDER — ORAL CARE MOUTH RINSE: 15.0000 mL | Freq: Once | OROMUCOSAL | Status: AC

## 2023-02-13 MED ORDER — KETOROLAC TROMETHAMINE 30 MG/ML IJ SOLN
INTRAMUSCULAR | Status: AC
  Filled 2023-02-13: qty 1

## 2023-02-13 MED ORDER — PHENYLEPHRINE 80 MCG/ML (10ML) SYRINGE FOR IV PUSH (FOR BLOOD PRESSURE SUPPORT)
PREFILLED_SYRINGE | INTRAVENOUS | Status: AC
  Filled 2023-02-13: qty 10

## 2023-02-13 MED ORDER — DEXAMETHASONE SODIUM PHOSPHATE 10 MG/ML IJ SOLN
INTRAMUSCULAR | Status: DC | PRN
  Administered 2023-02-13: 10 mg via INTRAVENOUS

## 2023-02-13 MED ORDER — CHLORHEXIDINE GLUCONATE 0.12 % MT SOLN: 15.0000 mL | Freq: Once | OROMUCOSAL | Status: AC

## 2023-02-13 MED ORDER — DEXMEDETOMIDINE HCL IN NACL 80 MCG/20ML IV SOLN
INTRAVENOUS | Status: DC | PRN
  Administered 2023-02-13: 4 ug via INTRAVENOUS
  Administered 2023-02-13 (×2): 8 ug via INTRAVENOUS

## 2023-02-13 MED ORDER — KETOROLAC TROMETHAMINE 30 MG/ML IJ SOLN
INTRAMUSCULAR | Status: DC | PRN
  Administered 2023-02-13: 30 mg via INTRAVENOUS

## 2023-02-13 MED ORDER — FENTANYL CITRATE (PF) 250 MCG/5ML IJ SOLN
INTRAMUSCULAR | Status: DC | PRN
  Administered 2023-02-13 (×2): 50 ug via INTRAVENOUS
  Administered 2023-02-13: 100 ug via INTRAVENOUS
  Administered 2023-02-13 (×2): 50 ug via INTRAVENOUS

## 2023-02-13 MED ORDER — ACETAMINOPHEN 160 MG/5ML PO SOLN: 325.0000 mg | Freq: Once | ORAL | Status: DC | PRN

## 2023-02-13 MED ORDER — MEPERIDINE HCL 25 MG/ML IJ SOLN: 6.2500 mg | INTRAMUSCULAR | Status: DC | PRN

## 2023-02-13 MED ORDER — PROMETHAZINE HCL 25 MG/ML IJ SOLN: 6.2500 mg | INTRAMUSCULAR | Status: DC | PRN

## 2023-02-13 MED ORDER — MIDAZOLAM HCL 2 MG/2ML IJ SOLN
INTRAMUSCULAR | Status: AC
  Filled 2023-02-13: qty 2

## 2023-02-13 MED ORDER — 0.9 % SODIUM CHLORIDE (POUR BTL) OPTIME
TOPICAL | Status: DC | PRN
  Administered 2023-02-13: 1000 mL

## 2023-02-13 MED ORDER — EPHEDRINE 5 MG/ML INJ
INTRAVENOUS | Status: AC
  Filled 2023-02-13: qty 5

## 2023-02-13 MED ORDER — KETOROLAC TROMETHAMINE 15 MG/ML IJ SOLN
15.0000 mg | Freq: Three times a day (TID) | INTRAMUSCULAR | Status: DC
  Administered 2023-02-13 – 2023-02-15 (×5): 15 mg via INTRAVENOUS
  Filled 2023-02-13 (×5): qty 1

## 2023-02-13 MED ORDER — CHLORHEXIDINE GLUCONATE 0.12 % MT SOLN
OROMUCOSAL | Status: AC
  Administered 2023-02-13: 15 mL via OROMUCOSAL
  Filled 2023-02-13: qty 15

## 2023-02-13 MED ORDER — LIDOCAINE 2% (20 MG/ML) 5 ML SYRINGE
INTRAMUSCULAR | Status: AC
  Filled 2023-02-13: qty 5

## 2023-02-13 MED ORDER — METHOCARBAMOL 1000 MG/10ML IJ SOLN
500.0000 mg | Freq: Three times a day (TID) | INTRAVENOUS | Status: DC
  Administered 2023-02-13: 500 mg via INTRAVENOUS
  Filled 2023-02-13: qty 500

## 2023-02-13 MED ORDER — ONDANSETRON HCL 4 MG/2ML IJ SOLN
INTRAMUSCULAR | Status: AC
  Filled 2023-02-13: qty 2

## 2023-02-13 MED ORDER — LACTATED RINGERS IV SOLN: INTRAVENOUS | Status: DC

## 2023-02-13 MED ORDER — ACETAMINOPHEN 325 MG PO TABS: 325.0000 mg | ORAL_TABLET | Freq: Once | ORAL | Status: DC | PRN

## 2023-02-13 MED ORDER — HYDROXYZINE HCL 25 MG PO TABS
50.0000 mg | ORAL_TABLET | Freq: Three times a day (TID) | ORAL | Status: DC | PRN
  Administered 2023-02-13: 50 mg via ORAL
  Filled 2023-02-13: qty 2

## 2023-02-13 MED ORDER — ALBUTEROL SULFATE HFA 108 (90 BASE) MCG/ACT IN AERS
INHALATION_SPRAY | RESPIRATORY_TRACT | Status: DC | PRN
  Administered 2023-02-13: 2 via RESPIRATORY_TRACT

## 2023-02-13 MED ORDER — SUGAMMADEX SODIUM 200 MG/2ML IV SOLN
INTRAVENOUS | Status: DC | PRN
  Administered 2023-02-13: 190.6 mg via INTRAVENOUS

## 2023-02-13 MED ORDER — VITAMIN D 25 MCG (1000 UNIT) PO TABS
2000.0000 [IU] | ORAL_TABLET | Freq: Two times a day (BID) | ORAL | Status: DC
  Administered 2023-02-13 – 2023-02-15 (×4): 2000 [IU] via ORAL
  Filled 2023-02-13 (×4): qty 2

## 2023-02-13 MED ORDER — HYDROMORPHONE HCL 1 MG/ML IJ SOLN
INTRAMUSCULAR | Status: AC
  Filled 2023-02-13: qty 1

## 2023-02-13 MED ORDER — VITAMIN C 500 MG PO TABS
1000.0000 mg | ORAL_TABLET | Freq: Every day | ORAL | Status: DC
  Administered 2023-02-14 – 2023-02-15 (×2): 1000 mg via ORAL
  Filled 2023-02-13 (×2): qty 2

## 2023-02-13 MED ORDER — HYDROMORPHONE HCL 1 MG/ML IJ SOLN
0.2500 mg | INTRAMUSCULAR | Status: DC | PRN
  Administered 2023-02-13 (×2): 0.5 mg via INTRAVENOUS

## 2023-02-13 MED ORDER — METHOCARBAMOL 500 MG PO TABS
1000.0000 mg | ORAL_TABLET | Freq: Three times a day (TID) | ORAL | Status: DC
  Administered 2023-02-14 – 2023-02-15 (×4): 1000 mg via ORAL
  Filled 2023-02-13 (×4): qty 2

## 2023-02-13 SURGICAL SUPPLY — 98 items
BAG COUNTER SPONGE SURGICOUNT (BAG) ×1 IMPLANT
BAG SPNG CNTER NS LX DISP (BAG) ×1
BANDAGE ESMARK 6X9 LF (GAUZE/BANDAGES/DRESSINGS) ×1 IMPLANT
BIT DRILL CAL (BIT) IMPLANT
BIT DRILL CALIBRATED 2.7 (BIT) IMPLANT
BLADE CLIPPER SURG (BLADE) IMPLANT
BLADE SURG 10 STRL SS (BLADE) ×1 IMPLANT
BLADE SURG 15 STRL LF DISP TIS (BLADE) ×1 IMPLANT
BLADE SURG 15 STRL SS (BLADE) ×1
BNDG CMPR 5X4 KNIT ELC UNQ LF (GAUZE/BANDAGES/DRESSINGS) ×1
BNDG CMPR 5X62 HK CLSR LF (GAUZE/BANDAGES/DRESSINGS) ×1
BNDG CMPR 6 X 5 YARDS HK CLSR (GAUZE/BANDAGES/DRESSINGS) ×1
BNDG CMPR 6"X 5 YARDS HK CLSR (GAUZE/BANDAGES/DRESSINGS) ×1
BNDG CMPR 9X6 STRL LF SNTH (GAUZE/BANDAGES/DRESSINGS)
BNDG COHESIVE 4X5 TAN STRL (GAUZE/BANDAGES/DRESSINGS) ×1 IMPLANT
BNDG ELASTIC 4INX 5YD STR LF (GAUZE/BANDAGES/DRESSINGS) IMPLANT
BNDG ELASTIC 4X5.8 VLCR STR LF (GAUZE/BANDAGES/DRESSINGS) ×1 IMPLANT
BNDG ELASTIC 6INX 5YD STR LF (GAUZE/BANDAGES/DRESSINGS) IMPLANT
BNDG ELASTIC 6X5.8 VLCR STR LF (GAUZE/BANDAGES/DRESSINGS) ×1 IMPLANT
BNDG ESMARK 6X9 LF (GAUZE/BANDAGES/DRESSINGS)
BNDG GAUZE DERMACEA FLUFF 4 (GAUZE/BANDAGES/DRESSINGS) ×1 IMPLANT
BNDG GZE DERMACEA 4 6PLY (GAUZE/BANDAGES/DRESSINGS) ×2
BONE CANC CHIPS 40CC CAN1/2 (Bone Implant) ×1 IMPLANT
BRUSH SCRUB EZ PLAIN DRY (MISCELLANEOUS) ×2 IMPLANT
CANISTER SUCT 3000ML PPV (MISCELLANEOUS) ×1 IMPLANT
COVER MAYO STAND STRL (DRAPES) IMPLANT
COVER SURGICAL LIGHT HANDLE (MISCELLANEOUS) ×1 IMPLANT
CUFF TOURN SGL QUICK 34 (TOURNIQUET CUFF)
CUFF TRNQT CYL 34X4.125X (TOURNIQUET CUFF) ×1 IMPLANT
DRAPE C-ARM 42X72 X-RAY (DRAPES) ×1 IMPLANT
DRAPE C-ARMOR (DRAPES) ×1 IMPLANT
DRAPE HALF SHEET 40X57 (DRAPES) IMPLANT
DRAPE INCISE IOBAN 66X45 STRL (DRAPES) ×1 IMPLANT
DRAPE U-SHAPE 47X51 STRL (DRAPES) ×1 IMPLANT
DRILL BIT CAL (BIT) ×1
DRSG ADAPTIC 3X8 NADH LF (GAUZE/BANDAGES/DRESSINGS) ×1 IMPLANT
DRSG MEPITEL 4X7.2 (GAUZE/BANDAGES/DRESSINGS) IMPLANT
ELECT REM PT RETURN 9FT ADLT (ELECTROSURGICAL) ×1
ELECTRODE REM PT RTRN 9FT ADLT (ELECTROSURGICAL) ×1 IMPLANT
GAUZE PAD ABD 8X10 STRL (GAUZE/BANDAGES/DRESSINGS) ×2 IMPLANT
GAUZE SPONGE 4X4 12PLY STRL (GAUZE/BANDAGES/DRESSINGS) ×1 IMPLANT
GLOVE BIO SURGEON STRL SZ 6.5 (GLOVE) IMPLANT
GLOVE BIO SURGEON STRL SZ7.5 (GLOVE) ×1 IMPLANT
GLOVE BIO SURGEON STRL SZ8 (GLOVE) ×1 IMPLANT
GLOVE BIOGEL PI IND STRL 6.5 (GLOVE) IMPLANT
GLOVE BIOGEL PI IND STRL 7.0 (GLOVE) IMPLANT
GLOVE BIOGEL PI IND STRL 7.5 (GLOVE) ×1 IMPLANT
GLOVE BIOGEL PI IND STRL 8 (GLOVE) ×1 IMPLANT
GLOVE SURG ORTHO LTX SZ7.5 (GLOVE) ×2 IMPLANT
GLOVE SURG SS PI 7.0 STRL IVOR (GLOVE) IMPLANT
GLOVE SURG SS PI 8.5 STRL STRW (GLOVE) IMPLANT
GOWN STRL REUS W/ TWL LRG LVL3 (GOWN DISPOSABLE) ×2 IMPLANT
GOWN STRL REUS W/ TWL XL LVL3 (GOWN DISPOSABLE) ×1 IMPLANT
GOWN STRL REUS W/TWL LRG LVL3 (GOWN DISPOSABLE) ×4
GOWN STRL REUS W/TWL XL LVL3 (GOWN DISPOSABLE) ×2
GRAFT BNE CHIP CANC 1-8 40 (Bone Implant) IMPLANT
IMMOBILIZER KNEE 22 UNIV (SOFTGOODS) ×1 IMPLANT
K-WIRE ACE 1.6X6 (WIRE) ×4
KIT BASIN OR (CUSTOM PROCEDURE TRAY) ×1 IMPLANT
KIT TURNOVER KIT B (KITS) ×1 IMPLANT
KWIRE ACE 1.6X6 (WIRE) IMPLANT
NDL SUT 6 .5 CRC .975X.05 MAYO (NEEDLE) IMPLANT
NEEDLE MAYO TAPER (NEEDLE) ×1
NS IRRIG 1000ML POUR BTL (IV SOLUTION) ×1 IMPLANT
PACK ORTHO EXTREMITY (CUSTOM PROCEDURE TRAY) ×1 IMPLANT
PAD ARMBOARD 7.5X6 YLW CONV (MISCELLANEOUS) ×2 IMPLANT
PAD CAST 4YDX4 CTTN HI CHSV (CAST SUPPLIES) ×1 IMPLANT
PADDING CAST COTTON 4X4 STRL (CAST SUPPLIES)
PADDING CAST COTTON 6X4 STRL (CAST SUPPLIES) ×1 IMPLANT
PLATE LOCK 5H STD RT PROX TIB (Plate) IMPLANT
SCREW CORTICAL 3.5MM 40MM (Screw) IMPLANT
SCREW CORTICAL 3.5MM 48MM (Screw) IMPLANT
SCREW LOCK CORT STAR 3.5X75 (Screw) IMPLANT
SCREW LOCK CORT STAR 3.5X80 (Screw) IMPLANT
SCREW LOCK CORT STAR 3.5X85 (Screw) IMPLANT
SCREW LOW PROF CORTICAL 3.5X80 (Screw) IMPLANT
SCREW LP 3.5X70MM (Screw) IMPLANT
SCREW LP 3.5X85MM (Screw) IMPLANT
SPONGE T-LAP 18X18 ~~LOC~~+RFID (SPONGE) ×1 IMPLANT
STAPLER VISISTAT 35W (STAPLE) ×1 IMPLANT
STOCKINETTE IMPERVIOUS LG (DRAPES) ×1 IMPLANT
SUCTION TUBE FRAZIER 10FR DISP (SUCTIONS) ×1 IMPLANT
SUT ETHILON 2 0 FS 18 (SUTURE) IMPLANT
SUT PROLENE 0 CT 2 (SUTURE) ×2 IMPLANT
SUT VIC AB 0 CT1 27 (SUTURE) ×1
SUT VIC AB 0 CT1 27XBRD ANBCTR (SUTURE) ×1 IMPLANT
SUT VIC AB 1 CT1 27 (SUTURE)
SUT VIC AB 1 CT1 27XBRD ANBCTR (SUTURE) ×1 IMPLANT
SUT VIC AB 1 CTX 27 (SUTURE) IMPLANT
SUT VIC AB 2-0 CT1 27 (SUTURE) ×1
SUT VIC AB 2-0 CT1 TAPERPNT 27 (SUTURE) ×2 IMPLANT
SYR 10ML LL (SYRINGE) IMPLANT
TOWEL GREEN STERILE (TOWEL DISPOSABLE) ×2 IMPLANT
TOWEL GREEN STERILE FF (TOWEL DISPOSABLE) ×1 IMPLANT
TRAY FOLEY MTR SLVR 16FR STAT (SET/KITS/TRAYS/PACK) IMPLANT
TUBE CONNECTING 12X1/4 (SUCTIONS) ×1 IMPLANT
WATER STERILE IRR 1000ML POUR (IV SOLUTION) ×2 IMPLANT
YANKAUER SUCT BULB TIP NO VENT (SUCTIONS) ×1 IMPLANT

## 2023-02-13 NOTE — Anesthesia Preprocedure Evaluation (Addendum)
Anesthesia Evaluation  Patient identified by MRN, date of birth, ID band Patient awake    Reviewed: Allergy & Precautions, NPO status , Patient's Chart, lab work & pertinent test results  Airway Mallampati: I  TM Distance: >3 FB Neck ROM: Full    Dental  (+) Teeth Intact, Dental Advisory Given   Pulmonary former smoker   breath sounds clear to auscultation       Cardiovascular negative cardio ROS  Rhythm:Regular Rate:Normal     Neuro/Psych negative neurological ROS  negative psych ROS   GI/Hepatic negative GI ROS, Neg liver ROS,,,  Endo/Other  negative endocrine ROS    Renal/GU negative Renal ROS     Musculoskeletal negative musculoskeletal ROS (+)    Abdominal   Peds  Hematology negative hematology ROS (+)   Anesthesia Other Findings   Reproductive/Obstetrics                             Anesthesia Physical Anesthesia Plan  ASA: 1  Anesthesia Plan: General   Post-op Pain Management: Tylenol PO (pre-op)* and Toradol IV (intra-op)*   Induction: Intravenous  PONV Risk Score and Plan: 3 and Ondansetron, Dexamethasone and Midazolam  Airway Management Planned: Oral ETT  Additional Equipment: None  Intra-op Plan:   Post-operative Plan: Extubation in OR  Informed Consent: I have reviewed the patients History and Physical, chart, labs and discussed the procedure including the risks, benefits and alternatives for the proposed anesthesia with the patient or authorized representative who has indicated his/her understanding and acceptance.       Plan Discussed with: CRNA  Anesthesia Plan Comments:        Anesthesia Quick Evaluation

## 2023-02-13 NOTE — Progress Notes (Signed)
No report received from sending unit prior to pt arrival to Pre-Op.
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
## 2023-02-13 NOTE — Progress Notes (Signed)
Orthopaedic Trauma Service Post Op plan   -S/p ORIF R Lateral tibial plateau fracture  NWB R leg x 6 weeks  Unrestricted ROM R knee   Hinged brace only needs to be on when mobilizing. It may be off otherwise  Do not let knee rest in flexion   Ice and elevate (ice man in place)   PT and OT evals   PT- please teach HEP for R  knee ROM- AROM, PROM. Prone exercises as well. No ROM restrictions.  Quad sets, SLR, LAQ, SAQ, heel slides, stretching, prone flexion and extension  Ankle theraband program, heel cord stretching, toe towel curls, etc  No pillows under bend of knee when at rest, ok to place under heel to help work on extension. Can also use zero knee bone foam if available DO NOT LET KNEE REST IN FLEXION!!!!!  - DVT/PE prophylaxis   Start lovenox tomorrow on POD 1  - Diet   Reg diet   - Dispo    Continue post op care  Likely ready for home Thursday or Friday     Patient ID: Ryan Powers, male   DOB: [birth year], 37 y.o.   MRN: [id number]

## 2023-02-13 NOTE — Plan of Care (Signed)

## 2023-02-13 NOTE — Progress Notes (Signed)
No changes overnight. Eager for repair. Pain adequately controlled.  The risks and benefits of surgery for right lateral tibial plateau repair were discussed with the patient, including the possibility of infection, nerve injury, vessel injury, wound breakdown, arthritis, symptomatic hardware, DVT/ PE, loss of motion, malunion, nonunion, and need for further surgery among others.These risks were acknowledged and consent provided to proceed.  Myrene Galas, MD [facility], [facility] [phone]

## 2023-02-13 NOTE — Transfer of Care (Signed)
Immediate Anesthesia Transfer of Care Note  Patient: Ryan Powers  Procedure(s) Performed: OPEN REDUCTION INTERNAL FIXATION (ORIF) TIBIAL PLATEAU; REPAIR LATERAL MENISCUS AND ANTERIOR FASCIOTOMY (Right: Knee)  Patient Location: PACU  Anesthesia Type:General  Level of Consciousness: awake, alert , and oriented  Airway & Oxygen Therapy: Patient Spontanous Breathing and Patient connected to nasal cannula oxygen  Post-op Assessment: Report given to RN, Post -op Vital signs reviewed and stable, and Patient moving all extremities X 4  Post vital signs: Reviewed and stable  Last Vitals:  Vitals Value Taken Time  BP 111/65 02/13/23 1608  Temp 36.8 C 02/13/23 1608  Pulse 102 02/13/23 1613  Resp 18 02/13/23 1613  SpO2 92 % 02/13/23 1613  Vitals shown include unfiled device data.  Last Pain:  Vitals:   02/13/23 1108  TempSrc:   PainSc: 6       Patients Stated Pain Goal: 2 (02/13/23 1108)  Complications: No notable events documented.

## 2023-02-13 NOTE — Anesthesia Procedure Notes (Signed)
Procedure Name: Intubation Date/Time: 02/13/2023 1:38 PM  Performed by: Lonia Mad, CRNAPre-anesthesia Checklist: Patient identified, Emergency Drugs available, Suction available and Patient being monitored Patient Re-evaluated:Patient Re-evaluated prior to induction Oxygen Delivery Method: Circle System Utilized Preoxygenation: Pre-oxygenation with 100% oxygen Induction Type: IV induction Ventilation: Mask ventilation without difficulty Laryngoscope Size: Mac and 4 Tube type: Oral Tube size: 7.5 mm Number of attempts: 1 Airway Equipment and Method: Stylet Placement Confirmation: ETT inserted through vocal cords under direct vision, positive ETCO2 and breath sounds checked- equal and bilateral Secured at: 21 cm Tube secured with: Tape Dental Injury: Teeth and Oropharynx as per pre-operative assessment

## 2023-02-14 ENCOUNTER — Encounter (HOSPITAL_COMMUNITY): Payer: Self-pay | Admitting: Orthopedic Surgery

## 2023-02-14 DIAGNOSIS — E559 Vitamin D deficiency, unspecified: Secondary | ICD-10-CM

## 2023-02-14 HISTORY — DX: Vitamin D deficiency, unspecified: E55.9

## 2023-02-14 LAB — CBC
HCT: 37.1 % — ABNORMAL LOW (ref 39.0–52.0)
Hemoglobin: 12.1 g/dL — ABNORMAL LOW (ref 13.0–17.0)
MCH: 29.1 pg (ref 26.0–34.0)
MCHC: 32.6 g/dL (ref 30.0–36.0)
MCV: 89.2 fL (ref 80.0–100.0)
Platelets: 203 10*3/uL (ref 150–400)
RBC: 4.16 MIL/uL — ABNORMAL LOW (ref 4.22–5.81)
RDW: 13.6 % (ref 11.5–15.5)
WBC: 14.7 10*3/uL — ABNORMAL HIGH (ref 4.0–10.5)
nRBC: 0 % (ref 0.0–0.2)

## 2023-02-14 MED ORDER — APIXABAN 2.5 MG PO TABS
2.5000 mg | ORAL_TABLET | Freq: Two times a day (BID) | ORAL | Status: DC
  Administered 2023-02-14 – 2023-02-15 (×3): 2.5 mg via ORAL
  Filled 2023-02-14 (×3): qty 1

## 2023-02-14 NOTE — Plan of Care (Signed)
  Problem: Education: Goal: Knowledge of General Education information will improve Description: Including pain rating scale, medication(s)/side effects and non-pharmacologic comfort measures Outcome: Progressing   Problem: Health Behavior/Discharge Planning: Goal: Ability to manage health-related needs will improve Outcome: Progressing   Problem: Nutrition: Goal: Adequate nutrition will be maintained Outcome: Progressing   Problem: Coping: Goal: Level of anxiety will decrease Outcome: Progressing   Problem: Elimination: Goal: Will not experience complications related to bowel motility Outcome: Progressing Goal: Will not experience complications related to urinary retention Outcome: Progressing   Problem: Pain Managment: Goal: General experience of comfort will improve Outcome: Progressing   Problem: Skin Integrity: Goal: Risk for impaired skin integrity will decrease Outcome: Progressing   Problem: Safety: Goal: Ability to remain free from injury will improve Outcome: Progressing
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
## 2023-02-14 NOTE — Anesthesia Postprocedure Evaluation (Signed)
Anesthesia Post Note  Patient: Tabias Swayze  Procedure(s) Performed: OPEN REDUCTION INTERNAL FIXATION (ORIF) TIBIAL PLATEAU; REPAIR LATERAL MENISCUS AND ANTERIOR FASCIOTOMY (Right: Knee)     Patient location during evaluation: PACU Anesthesia Type: General Level of consciousness: awake and alert Pain management: pain level controlled Vital Signs Assessment: post-procedure vital signs reviewed and stable Respiratory status: spontaneous breathing, nonlabored ventilation, respiratory function stable and patient connected to nasal cannula oxygen Cardiovascular status: blood pressure returned to baseline and stable Postop Assessment: no apparent nausea or vomiting Anesthetic complications: no   No notable events documented.            Shelton Silvas
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
## 2023-02-14 NOTE — Progress Notes (Signed)
Orthopedic Tech Progress Note Patient Details:  Ryan Powers 06-10-1986 [id number]  Ortho Devices Type of Ortho Device: Bone foam zero knee Ortho Device/Splint Location: rle Ortho Device/Splint Interventions: Ordered, Application   Post Interventions Patient Tolerated: Well Instructions Provided: Care of device  [name] 02/14/2023, 2:32 PM

## 2023-02-15 ENCOUNTER — Other Ambulatory Visit (HOSPITAL_COMMUNITY): Payer: Self-pay

## 2023-02-15 LAB — CBC
HCT: 35.3 % — ABNORMAL LOW (ref 39.0–52.0)
Hemoglobin: 11.6 g/dL — ABNORMAL LOW (ref 13.0–17.0)
MCH: 29.7 pg (ref 26.0–34.0)
MCHC: 32.9 g/dL (ref 30.0–36.0)
MCV: 90.3 fL (ref 80.0–100.0)
Platelets: 201 10*3/uL (ref 150–400)
RBC: 3.91 MIL/uL — ABNORMAL LOW (ref 4.22–5.81)
RDW: 13.9 % (ref 11.5–15.5)
WBC: 12.9 10*3/uL — ABNORMAL HIGH (ref 4.0–10.5)
nRBC: 0 % (ref 0.0–0.2)

## 2023-02-15 MED ORDER — ACETAMINOPHEN 500 MG PO TABS
1000.0000 mg | ORAL_TABLET | Freq: Three times a day (TID) | ORAL | 0 refills | Status: AC
  Filled 2023-02-15: qty 30, 5d supply, fill #0

## 2023-02-15 MED ORDER — CHOLECALCIFEROL 125 MCG (5000 UT) PO TABS
ORAL_TABLET | Freq: Every day | ORAL | 6 refills | Status: AC
  Filled 2023-02-15: qty 100, 100d supply, fill #0

## 2023-02-15 MED ORDER — POLYETHYLENE GLYCOL 3350 17 GM/SCOOP PO POWD
17.0000 g | Freq: Every day | ORAL | 0 refills | Status: AC
  Filled 2023-02-15: qty 238, 14d supply, fill #0

## 2023-02-15 MED ORDER — NALOXONE HCL 4 MG/0.1ML NA LIQD
NASAL | 1 refills | Status: AC
  Filled 2023-02-15: qty 2, 1d supply, fill #0

## 2023-02-15 MED ORDER — ASCORBIC ACID 1000 MG PO TABS
1000.0000 mg | ORAL_TABLET | Freq: Every day | ORAL | 1 refills | Status: AC
  Filled 2023-02-15: qty 30, 30d supply, fill #0

## 2023-02-15 MED ORDER — METHOCARBAMOL 500 MG PO TABS
500.0000 mg | ORAL_TABLET | Freq: Four times a day (QID) | ORAL | 0 refills | Status: DC | PRN
  Filled 2023-02-15: qty 60, 8d supply, fill #0

## 2023-02-15 MED ORDER — HYDROMORPHONE HCL 4 MG PO TABS
4.0000 mg | ORAL_TABLET | Freq: Four times a day (QID) | ORAL | 0 refills | Status: DC | PRN
  Filled 2023-02-15: qty 40, 7d supply, fill #0

## 2023-02-15 MED ORDER — APIXABAN 2.5 MG PO TABS
2.5000 mg | ORAL_TABLET | Freq: Two times a day (BID) | ORAL | 0 refills | Status: AC
  Filled 2023-02-15: qty 60, 30d supply, fill #0

## 2023-02-15 NOTE — Progress Notes (Signed)
Discharge instructions given. Patient verbalized understanding and all questions were answered.  ?
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
## 2023-02-15 NOTE — Plan of Care (Signed)

## 2023-02-15 NOTE — Progress Notes (Signed)
Mobility Specialist Progress Note:    02/15/23 1021  Mobility  Activity Ambulated with assistance in hallway  Level of Assistance Contact guard assist, steadying assist  Assistive Device Crutches;Front wheel walker  Distance Ambulated (ft) 300 ft  RLE Weight Bearing NWB  Activity Response Tolerated well  Mobility Referral Yes  $Mobility charge 1 Mobility  Mobility Specialist Start Time (ACUTE ONLY) 0945  Mobility Specialist Stop Time (ACUTE ONLY) 1003  Mobility Specialist Time Calculation (min) (ACUTE ONLY) 18 min   Pt received in bed, agreeable to ambulate. Family member was able to don pts hinge brace on correctly and pt needed no physical assistance for STS. During ambulation pt c/o R hip pain which caused him to have x2 LOB moments, pt was able to self correct. Upon returning to room pt requested to sit up in the recliner and used a RW to return to recliner. Pt situated in chair w/ call needs met.   Thompson Grayer Mobility Specialist  Please contact vis Secure Chat or  Rehab Office [phone]

## 2023-02-15 NOTE — Discharge Summary (Addendum)
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 Ace wrap.  Alternatively can use compression sock Orthopedic device(s):  walker/crutches, hinged knee brace when up mobilizing Showering: Okay to shower and clean wounds with soap and water once there is no drainage VTE prophylaxis: Eliquis 2.5 mg p.o. twice daily for 30 days Pain control: Multimodal: Dilaudid, Robaxin, Tylenol Bone Health/Optimization: 5000 IUs vitamin D3 daily Follow - up plan: 2 weeks Contact information:  Myrene Galas MD, Montez Morita PA-C    Signed:  Mearl Latin, PA-C [phone] (C) 02/15/2023, 9:54  AM  [facility] [address] [place] [address] [phone] Collier Bullock (F)

## 2023-02-28 DIAGNOSIS — S82121D Displaced fracture of lateral condyle of right tibia, subsequent encounter for closed fracture with routine healing: Secondary | ICD-10-CM | POA: Diagnosis not present

## 2023-02-28 DIAGNOSIS — S83281D Other tear of lateral meniscus, current injury, right knee, subsequent encounter: Secondary | ICD-10-CM | POA: Diagnosis not present

## 2023-02-28 DIAGNOSIS — M25561 Pain in right knee: Secondary | ICD-10-CM | POA: Diagnosis not present

## 2023-03-01 DIAGNOSIS — M25661 Stiffness of right knee, not elsewhere classified: Secondary | ICD-10-CM | POA: Diagnosis not present

## 2023-03-01 DIAGNOSIS — R260 Ataxic gait: Secondary | ICD-10-CM | POA: Diagnosis not present

## 2023-03-01 DIAGNOSIS — M25561 Pain in right knee: Secondary | ICD-10-CM | POA: Diagnosis not present

## 2023-03-01 DIAGNOSIS — Z5189 Encounter for other specified aftercare: Secondary | ICD-10-CM | POA: Diagnosis not present

## 2023-03-06 DIAGNOSIS — M25561 Pain in right knee: Secondary | ICD-10-CM | POA: Diagnosis not present

## 2023-03-06 DIAGNOSIS — M25661 Stiffness of right knee, not elsewhere classified: Secondary | ICD-10-CM | POA: Diagnosis not present

## 2023-03-06 DIAGNOSIS — Z5189 Encounter for other specified aftercare: Secondary | ICD-10-CM | POA: Diagnosis not present

## 2023-03-06 DIAGNOSIS — R260 Ataxic gait: Secondary | ICD-10-CM | POA: Diagnosis not present

## 2023-03-08 DIAGNOSIS — M25661 Stiffness of right knee, not elsewhere classified: Secondary | ICD-10-CM | POA: Diagnosis not present

## 2023-03-08 DIAGNOSIS — R260 Ataxic gait: Secondary | ICD-10-CM | POA: Diagnosis not present

## 2023-03-08 DIAGNOSIS — Z5189 Encounter for other specified aftercare: Secondary | ICD-10-CM | POA: Diagnosis not present

## 2023-03-08 DIAGNOSIS — M25561 Pain in right knee: Secondary | ICD-10-CM | POA: Diagnosis not present

## 2023-03-10 ENCOUNTER — Other Ambulatory Visit (HOSPITAL_COMMUNITY): Payer: Self-pay

## 2023-03-12 ENCOUNTER — Other Ambulatory Visit: Payer: Self-pay

## 2023-03-12 MED ORDER — METHOCARBAMOL 500 MG PO TABS
500.0000 mg | ORAL_TABLET | Freq: Four times a day (QID) | ORAL | 0 refills | Status: AC | PRN
  Filled 2023-03-12: qty 60, 8d supply, fill #0

## 2023-03-21 DIAGNOSIS — M25561 Pain in right knee: Secondary | ICD-10-CM | POA: Diagnosis not present

## 2023-03-21 DIAGNOSIS — S82121D Displaced fracture of lateral condyle of right tibia, subsequent encounter for closed fracture with routine healing: Secondary | ICD-10-CM | POA: Diagnosis not present

## 2023-03-21 DIAGNOSIS — S83281D Other tear of lateral meniscus, current injury, right knee, subsequent encounter: Secondary | ICD-10-CM | POA: Diagnosis not present

## 2023-04-16 DIAGNOSIS — S82121D Displaced fracture of lateral condyle of right tibia, subsequent encounter for closed fracture with routine healing: Secondary | ICD-10-CM | POA: Diagnosis not present

## 2023-04-16 DIAGNOSIS — M25561 Pain in right knee: Secondary | ICD-10-CM | POA: Diagnosis not present

## 2023-04-16 DIAGNOSIS — S83281D Other tear of lateral meniscus, current injury, right knee, subsequent encounter: Secondary | ICD-10-CM | POA: Diagnosis not present

## 2023-06-05 IMAGING — CT CT RENAL STONE PROTOCOL
2 of 4 series · 17 of 46 positions shown, 19 images · non-contrast
Comparison: None

CLINICAL DATA: RIGHT-side flank pain, question kidney stone versus
appendicitis, onset of abdominal pain yesterday, nausea, poor
appetite

EXAM:
CT ABDOMEN AND PELVIS WITHOUT CONTRAST
TECHNIQUE: Multidetector CT imaging of the abdomen and pelvis was performed
following the standard protocol without IV contrast. Sagittal and
coronal MPR images reconstructed from axial data set. No oral
contrast administered.

[Series 2: axial st · axial · 0.81mm/px · z∈[+812,+1222]mm · 14 of 90 slices shown, 16 images]
[im 4/90  soft-tissue]
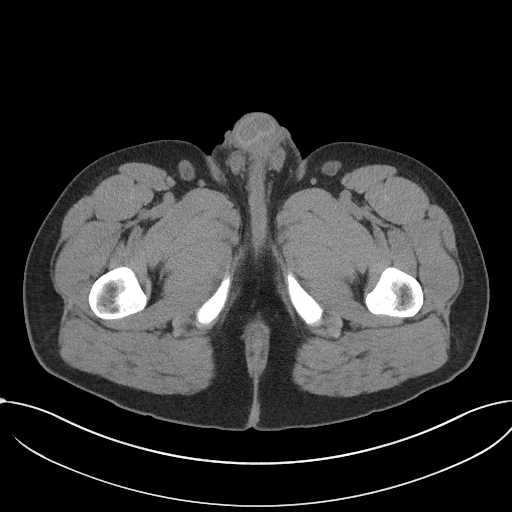
[im 4/90  bone]
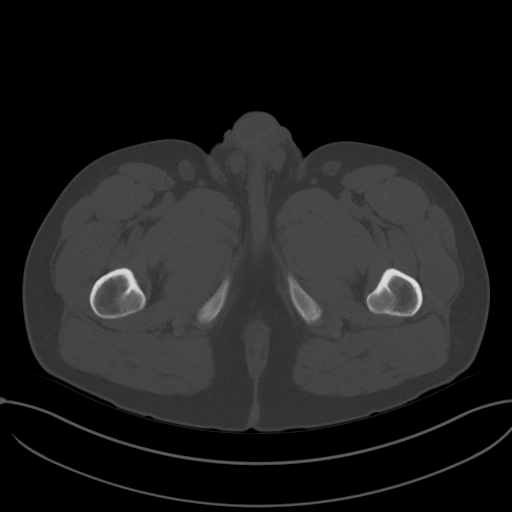
[im 12/90  soft-tissue]
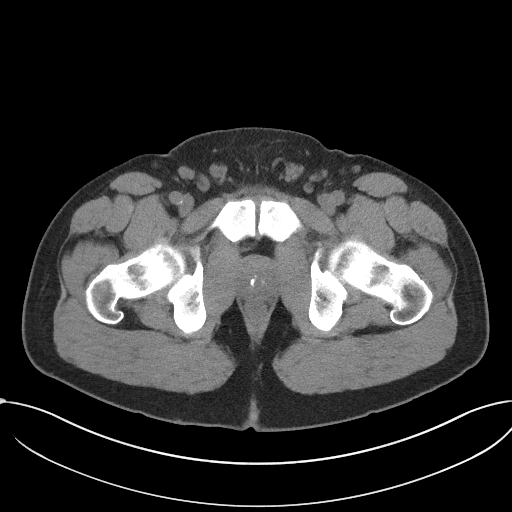
[im 19/90  soft-tissue]
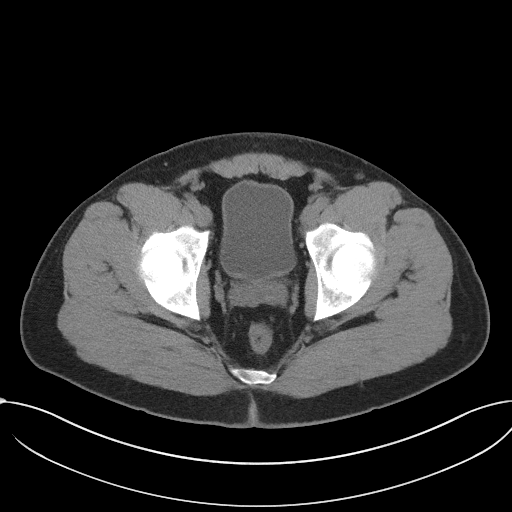
[im 23/90  soft-tissue]
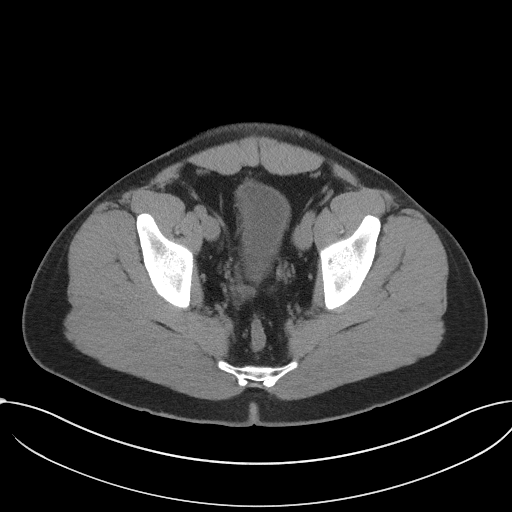
[im 30/90  soft-tissue]
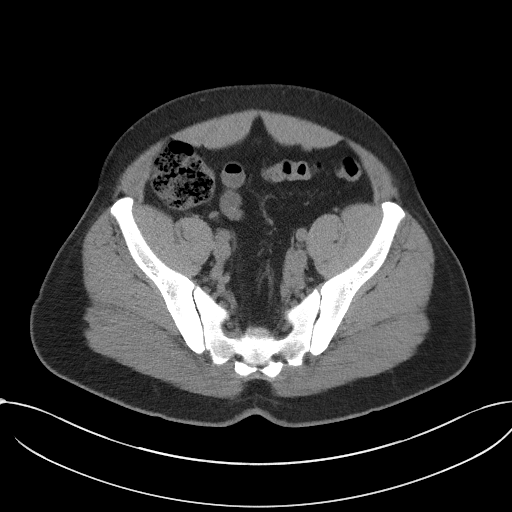
[im 38/90  soft-tissue]
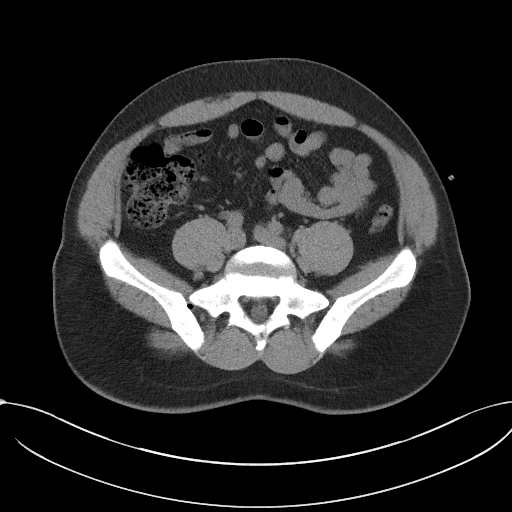
[im 41/90  soft-tissue]
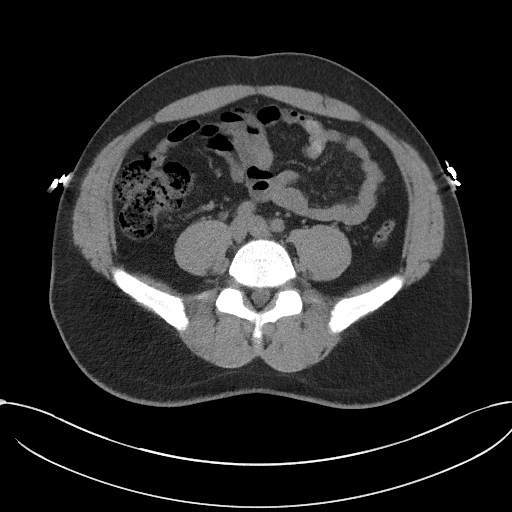
[im 49/90  soft-tissue]
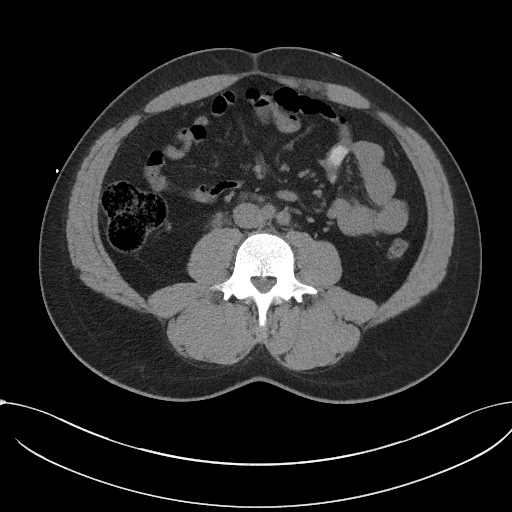
[im 52/90  soft-tissue]
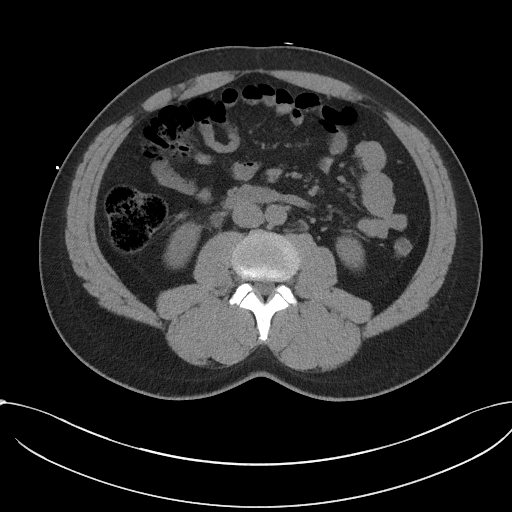
[im 52/90  bone]
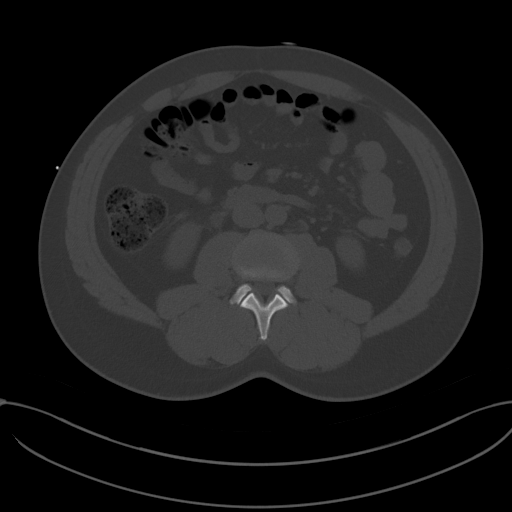
[im 60/90  soft-tissue]
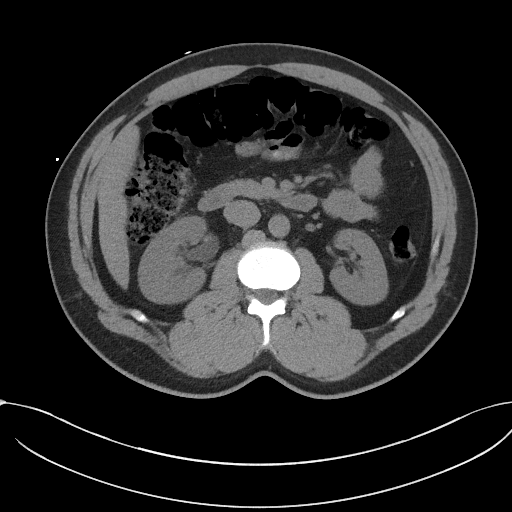
[im 67/90  soft-tissue]
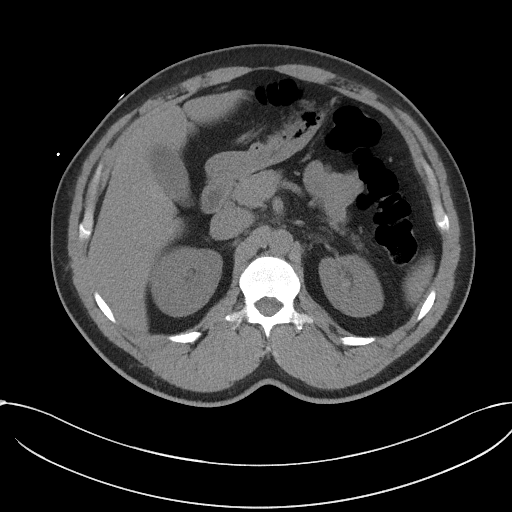
[im 71/90  soft-tissue]
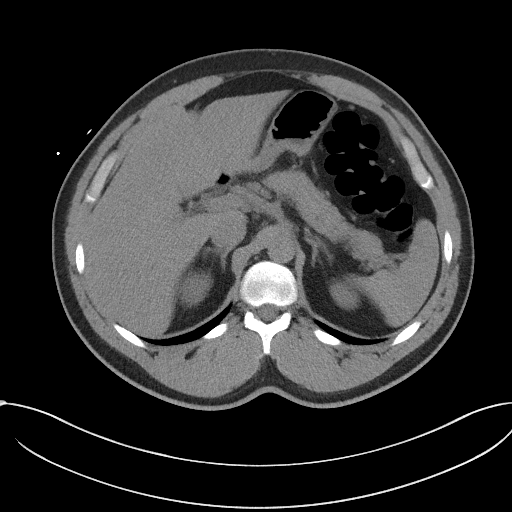
[im 78/90  soft-tissue]
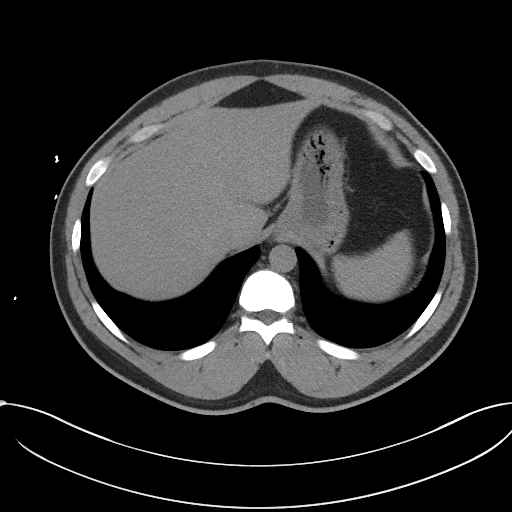
[im 86/90  soft-tissue]
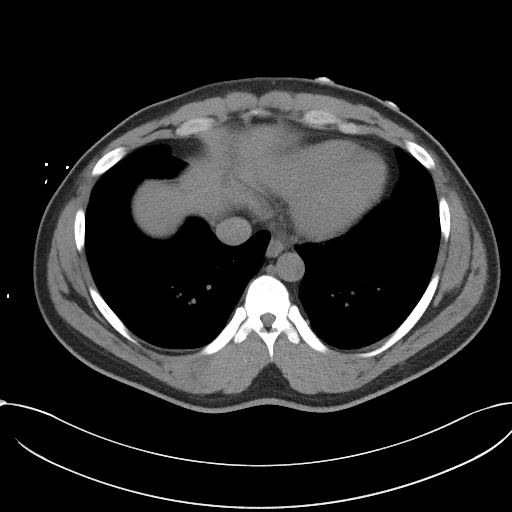

[Series 4: coronal st · coronal · 0.84mm/px · 3 of 99 slices shown]
[im 33/99  soft-tissue]
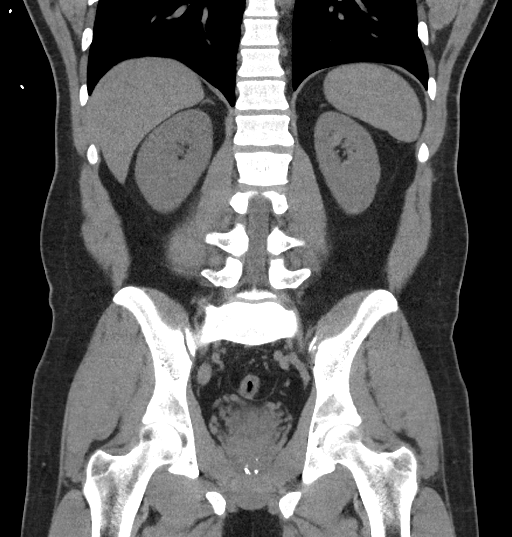
[im 44/99  soft-tissue]
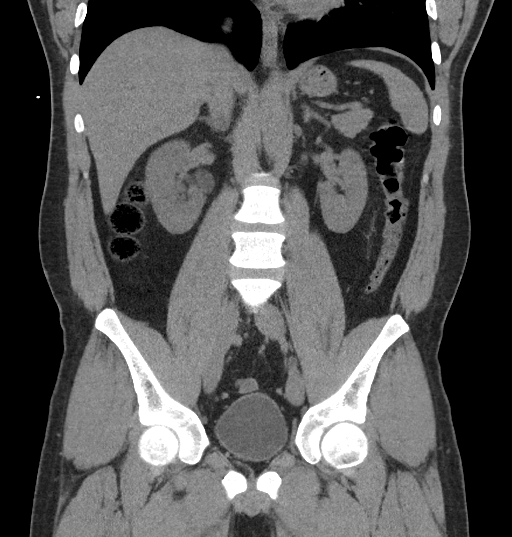
[im 55/99  soft-tissue]
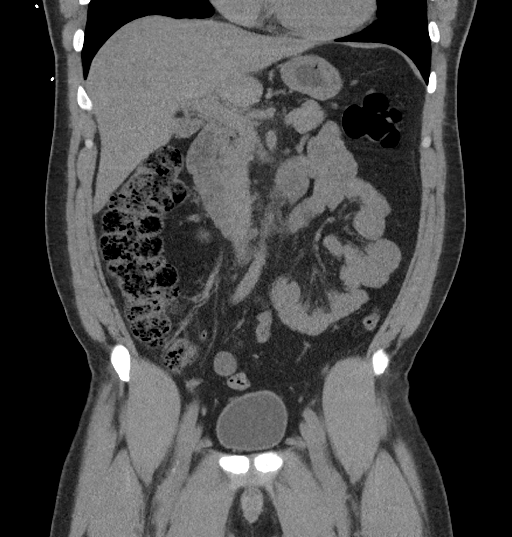

[17 of 46 positions shown; findings below may reference images not displayed]

FINDINGS: Lower chest: Lung bases clear

Hepatobiliary: Gallbladder and liver normal appearance

Pancreas: Normal appearance

Spleen: Normal appearance

Adrenals/Urinary Tract: Adrenal glands and LEFT kidney normal
appearance. RIGHT hydronephrosis and hydroureter secondary to a 3 mm
mid RIGHT ureteral calculus image 60. Minimal RIGHT peripelvic and
peri-ureteral edema. Slight enlargement of RIGHT kidney. LEFT ureter
and bladder unremarkable.

Stomach/Bowel: Normal appendix. Stomach and bowel loops
unremarkable.

Vascular/Lymphatic: Aorta normal caliber. Normal sized inguinal
lymph nodes. No definite abdominal or pelvic adenopathy.

Reproductive: Unremarkable seminal vesicles. Prostate gland measures
4.1 x 3.7 x 2.7 cm (volume = 21 cm^3).

Other: No free air or free fluid. Tiny umbilical hernia containing
fat. No additional inflammatory process.

Musculoskeletal: Osseous structures unremarkable.
IMPRESSION: Mild RIGHT hydronephrosis and proximal hydroureter secondary to a 3
mm mid RIGHT ureteral calculus.

## 2023-06-18 ENCOUNTER — Emergency Department (HOSPITAL_BASED_OUTPATIENT_CLINIC_OR_DEPARTMENT_OTHER): Payer: BC Managed Care – PPO

## 2023-06-18 ENCOUNTER — Emergency Department (HOSPITAL_BASED_OUTPATIENT_CLINIC_OR_DEPARTMENT_OTHER)
Admission: EM | Admit: 2023-06-18 | Discharge: 2023-06-18 | Disposition: A | Discharge status: Home / Self Care | Payer: BC Managed Care – PPO | Attending: Emergency Medicine | Admitting: Emergency Medicine

## 2023-06-18 ENCOUNTER — Other Ambulatory Visit: Payer: Self-pay

## 2023-06-18 ENCOUNTER — Encounter (HOSPITAL_BASED_OUTPATIENT_CLINIC_OR_DEPARTMENT_OTHER): Payer: Self-pay | Admitting: Urology

## 2023-06-18 DIAGNOSIS — N1330 Unspecified hydronephrosis: Secondary | ICD-10-CM | POA: Insufficient documentation

## 2023-06-18 DIAGNOSIS — Z7901 Long term (current) use of anticoagulants: Secondary | ICD-10-CM | POA: Diagnosis not present

## 2023-06-18 DIAGNOSIS — N132 Hydronephrosis with renal and ureteral calculous obstruction: Secondary | ICD-10-CM | POA: Diagnosis not present

## 2023-06-18 DIAGNOSIS — N201 Calculus of ureter: Secondary | ICD-10-CM | POA: Diagnosis not present

## 2023-06-18 DIAGNOSIS — R109 Unspecified abdominal pain: Secondary | ICD-10-CM | POA: Diagnosis not present

## 2023-06-18 DIAGNOSIS — R1031 Right lower quadrant pain: Secondary | ICD-10-CM | POA: Diagnosis not present

## 2023-06-18 LAB — BASIC METABOLIC PANEL
Anion gap: 12 (ref 5–15)
BUN: 13 mg/dL (ref 6–20)
CO2: 23 mmol/L (ref 22–32)
Calcium: 9.7 mg/dL (ref 8.9–10.3)
Chloride: 101 mmol/L (ref 98–111)
Creatinine, Ser: 1.01 mg/dL (ref 0.61–1.24)
GFR, Estimated: >60 mL/min (ref >60)
Glucose, Bld: 95 mg/dL (ref 70–99)
Potassium: 3.8 mmol/L (ref 3.5–5.1)
Sodium: 136 mmol/L (ref 135–145)

## 2023-06-18 LAB — RAPID URINE DRUG SCREEN, HOSP PERFORMED
Amphetamines: NOT DETECTED
Barbiturates: NOT DETECTED
Benzodiazepines: NOT DETECTED
Cocaine: NOT DETECTED
Opiates: NOT DETECTED
Tetrahydrocannabinol: NOT DETECTED

## 2023-06-18 LAB — CBC
HCT: 42.4 % (ref 39.0–52.0)
Hemoglobin: 14.1 g/dL (ref 13.0–17.0)
MCH: 28.7 pg (ref 26.0–34.0)
MCHC: 33.3 g/dL (ref 30.0–36.0)
MCV: 86.4 fL (ref 80.0–100.0)
Platelets: 267 10*3/uL (ref 150–400)
RBC: 4.91 MIL/uL (ref 4.22–5.81)
RDW: 14.3 % (ref 11.5–15.5)
WBC: 10 10*3/uL (ref 4.0–10.5)
nRBC: 0 % (ref 0.0–0.2)

## 2023-06-18 LAB — DIFFERENTIAL
Abs Immature Granulocytes: 0.03 10*3/uL (ref 0.00–0.07)
Basophils Absolute: 0 10*3/uL (ref 0.0–0.1)
Basophils Relative: 0 %
Eosinophils Absolute: 0.1 10*3/uL (ref 0.0–0.5)
Eosinophils Relative: 1 %
Immature Granulocytes: 0 %
Lymphocytes Relative: 22 %
Lymphs Abs: 2.2 10*3/uL (ref 0.7–4.0)
Monocytes Absolute: 0.8 10*3/uL (ref 0.1–1.0)
Monocytes Relative: 8 %
Neutro Abs: 7 10*3/uL (ref 1.7–7.7)
Neutrophils Relative %: 69 %

## 2023-06-18 LAB — URINALYSIS, ROUTINE W REFLEX MICROSCOPIC
Bilirubin Urine: NEGATIVE
Glucose, UA: NEGATIVE mg/dL
Ketones, ur: NEGATIVE mg/dL
Leukocytes,Ua: NEGATIVE
Nitrite: NEGATIVE
Protein, ur: 100 mg/dL — AB
Specific Gravity, Urine: >=1.03 (ref 1.005–1.030)
pH: 6 (ref 5.0–8.0)

## 2023-06-18 LAB — HEPATIC FUNCTION PANEL
ALT: 42 U/L (ref 0–44)
AST: 33 U/L (ref 15–41)
Albumin: 4.4 g/dL (ref 3.5–5.0)
Alkaline Phosphatase: 75 U/L (ref 38–126)
Bilirubin, Direct: 0.1 mg/dL (ref 0.0–0.2)
Indirect Bilirubin: 0.9 mg/dL (ref 0.3–0.9)
Total Bilirubin: 1 mg/dL (ref <1.2)
Total Protein: 7.9 g/dL (ref 6.5–8.1)

## 2023-06-18 LAB — URINALYSIS, MICROSCOPIC (REFLEX): RBC / HPF: >50 RBC/hpf (ref 0–5)

## 2023-06-18 MED ORDER — TRAMADOL HCL 50 MG PO TABS: 50.0000 mg | ORAL_TABLET | Freq: Four times a day (QID) | ORAL | 0 refills | Status: AC | PRN

## 2023-06-18 MED ORDER — CEPHALEXIN 500 MG PO CAPS: 500.0000 mg | ORAL_CAPSULE | Freq: Three times a day (TID) | ORAL | 0 refills | Status: AC

## 2023-06-18 MED ORDER — IOHEXOL 300 MG/ML  SOLN
100.0000 mL | Freq: Once | INTRAMUSCULAR | Status: AC | PRN
  Administered 2023-06-18: 100 mL via INTRAVENOUS

## 2023-06-18 MED ORDER — TAMSULOSIN HCL 0.4 MG PO CAPS: 0.4000 mg | ORAL_CAPSULE | Freq: Every day | ORAL | 0 refills | Status: AC

## 2023-06-18 MED ORDER — ONDANSETRON HCL 4 MG PO TABS
4.0000 mg | ORAL_TABLET | ORAL | 0 refills | Status: AC | PRN
Start: 2023-06-18 — End: ?

## 2023-06-18 MED ORDER — SODIUM CHLORIDE 0.9 % IV BOLUS
1000.0000 mL | Freq: Once | INTRAVENOUS | Status: AC
  Administered 2023-06-18: 1000 mL via INTRAVENOUS

## 2023-06-18 NOTE — Discharge Instructions (Signed)
It was a pleasure caring for you today in the emergency department.  You have a 3 mm kidney stone that is likely the source of your discomfort.  This should pass on its own.  If you are still having pain after 1 week please follow-up with urology for evaluation.  Drink plenty of fluids over the next week.  Please return to the emergency department for any worsening or worrisome symptoms, such as but not limited to: Fever, abdominal pain, significant blood in your urine, vomiting, etc.

## 2023-06-18 NOTE — ED Notes (Signed)
Pt transported to CT ?

## 2023-06-18 NOTE — ED Provider Notes (Signed)
[facility] Provider Note  CSN: [id number] Arrival date & time: 06/18/23 1504  Chief Complaint(s) Pelvic Pain  HPI Ryan Powers is a 37 y.o. male with past medical history as below, significant for tibial platean fx, vitamin d deficiency who presents to the ED with complaint of abdominal pain.  Patient reports low back pain began this morning, has since migrated to his right lower pelvis area.  Had some nausea and vomiting earlier, nauseous and subsided.  No change to bowel or bladder function.  No recent fevers or chills.  No prior abdominal surgeries or abdominal trauma in the past 24 hours.  Took some Pepcid earlier which did improve his symptoms.  He has had kidney stone in the past and feels the pain is somewhat similar  Past Medical History Past Medical History:  Diagnosis Date   Vitamin D insufficiency 02/14/2023   Patient Active Problem List   Diagnosis Date Noted   Vitamin D insufficiency 02/14/2023   Closed fracture of right tibial plateau 02/12/2023   Tibial plateau fracture, right 02/11/2023   Fracture of right tibial plateau 02/11/2023   Home Medication(s) Prior to Admission medications   Medication Sig Start Date End Date Taking? Authorizing Provider  cephALEXin (KEFLEX) 500 MG capsule Take 1 capsule (500 mg total) by mouth 3 (three) times daily for 7 days. 06/18/23 06/25/23 Yes Tanda Rockers A, DO  ondansetron (ZOFRAN) 4 MG tablet Take 1 tablet (4 mg total) by mouth every 4 (four) hours as needed for nausea or vomiting. 06/18/23  Yes Tanda Rockers A, DO  tamsulosin (FLOMAX) 0.4 MG CAPS capsule Take 1 capsule (0.4 mg total) by mouth daily for 14 days. 06/18/23 07/02/23 Yes Sloan Leiter, DO  traMADol (ULTRAM) 50 MG tablet Take 1 tablet (50 mg total) by mouth every 6 (six) hours as needed. 06/18/23  Yes Tanda Rockers A, DO  acetaminophen (TYLENOL) 500 MG tablet Take 2 tablets (1,000 mg total) by mouth every 8 (eight) hours.  02/15/23   Montez Morita, PA-C  apixaban (ELIQUIS) 2.5 MG TABS tablet Take 1 tablet (2.5 mg total) by mouth 2 (two) times daily. 02/15/23 03/17/23  Montez Morita, PA-C  ascorbic acid (VITAMIN C) 1000 MG tablet Take 1 tablet (1,000 mg total) by mouth daily. 02/15/23   Montez Morita, PA-C  Cholecalciferol 125 MCG (5000 UT) TABS Take 1 tablet by mouth daily. 02/15/23   Montez Morita, PA-C  methocarbamol (ROBAXIN) 500 MG tablet Take 1-2 tablets (500-1,000 mg total) by mouth every 6 (six) hours as needed for muscle spasms. 03/12/23     naloxone (NARCAN) nasal spray 4 mg/0.1 mL 1 spray in either nostril at signs of opioid overdose.  May repeat in opposite nostril with new spray in 2-3 minutes if no or minimal response such as no improvement in breathing or responsiveness 02/15/23   Montez Morita, PA-C  polyethylene glycol powder (GLYCOLAX/MIRALAX) 17 GM/SCOOP powder Take 17 g (1 capful) by mouth daily. 02/15/23   Montez Morita, PA-C  Past Surgical History Past Surgical History:  Procedure Laterality Date   ORIF TIBIA PLATEAU Right 02/13/2023   Procedure: OPEN REDUCTION INTERNAL FIXATION (ORIF) TIBIAL PLATEAU; REPAIR LATERAL MENISCUS AND ANTERIOR FASCIOTOMY;  Surgeon: Myrene Galas, MD;  Location: [facility] OR;  Service: Orthopedics;  Laterality: Right;   WISDOM TOOTH EXTRACTION     Family History History reviewed. No pertinent family history.  Social History Social History   Tobacco Use   Smoking status: Former    Current packs/day: 0.00    Types: Cigarettes    Quit date: 02/17/2019    Years since quitting: 4.3   Smokeless tobacco: Never  Vaping Use   Vaping status: Never Used  Substance Use Topics   Alcohol use: Yes    Comment: weekends   Drug use: No   Allergies Percocet [oxycodone-acetaminophen]  Review of Systems Review of Systems  Constitutional:  Negative for chills and fever.   Respiratory:  Negative for chest tightness and shortness of breath.   Cardiovascular:  Negative for chest pain.  Gastrointestinal:  Positive for abdominal pain, nausea and vomiting.  Genitourinary:  Negative for flank pain.  Musculoskeletal:  Negative for joint swelling.  Skin:  Negative for rash and wound.  Neurological:  Negative for weakness and headaches.    Physical Exam Vital Signs  I have reviewed the triage vital signs BP 121/80   Pulse 61   Temp 98.1 F (36.7 C)   Resp 19   Ht 5\' 9"  (1.753 m)   Wt 90.7 kg   SpO2 98%   BMI 29.53 kg/m  Physical Exam Vitals and nursing note reviewed.  Constitutional:      General: He is not in acute distress.    Appearance: He is well-developed.  HENT:     Head: Normocephalic and atraumatic.     Right Ear: External ear normal.     Left Ear: External ear normal.     Mouth/Throat:     Mouth: Mucous membranes are moist.  Eyes:     General: No scleral icterus. Cardiovascular:     Rate and Rhythm: Normal rate and regular rhythm.     Pulses: Normal pulses.     Heart sounds: Normal heart sounds.  Pulmonary:     Effort: Pulmonary effort is normal. No respiratory distress.     Breath sounds: Normal breath sounds.  Abdominal:     General: Abdomen is flat.     Palpations: Abdomen is soft.     Tenderness: There is abdominal tenderness. Positive signs include Rovsing's sign and McBurney's sign.    Musculoskeletal:     Cervical back: No rigidity.     Right lower leg: No edema.     Left lower leg: No edema.  Skin:    General: Skin is warm and dry.     Capillary Refill: Capillary refill takes less than 2 seconds.  Neurological:     Mental Status: He is alert.  Psychiatric:        Mood and Affect: Mood normal.        Behavior: Behavior normal.     ED Results and Treatments Labs (all labs ordered are listed, but only abnormal results are displayed) Labs Reviewed  URINALYSIS, ROUTINE W REFLEX MICROSCOPIC - Abnormal; Notable for  the following components:      Result Value   APPearance CLOUDY (*)    Hgb urine dipstick LARGE (*)    Protein, ur 100 (*)    All other components within normal limits  URINALYSIS,  MICROSCOPIC (REFLEX) - Abnormal; Notable for the following components:   Bacteria, UA RARE (*)    All other components within normal limits  URINE CULTURE  BASIC METABOLIC PANEL  CBC  HEPATIC FUNCTION PANEL  DIFFERENTIAL  RAPID URINE DRUG SCREEN, HOSP PERFORMED                                                                                                                          Radiology CT ABDOMEN PELVIS W CONTRAST  Result Date: 06/18/2023 CLINICAL DATA:  Right lower quadrant abdominal pain. EXAM: CT ABDOMEN AND PELVIS WITH CONTRAST TECHNIQUE: Multidetector CT imaging of the abdomen and pelvis was performed using the standard protocol following bolus administration of intravenous contrast. RADIATION DOSE REDUCTION: This exam was performed according to the departmental dose-optimization program which includes automated exposure control, adjustment of the mA and/or kV according to patient size and/or use of iterative reconstruction technique. CONTRAST:  OMNIPAQUE IOHEXOL 300 MG/ML  SOLN COMPARISON:  CT abdomen pelvis dated 05/08/2021. FINDINGS: Lower chest: The visualized lung bases are clear. No intra-abdominal free air or free fluid. Hepatobiliary: The liver is unremarkable. No biliary ductal dilatation. The gallbladder is unremarkable. Pancreas: Unremarkable. No pancreatic ductal dilatation or surrounding inflammatory changes. Spleen: Normal in size without focal abnormality. Adrenals/Urinary Tract: The adrenal glands are unremarkable. There is a 3 mm stone in the distal right ureter with mild right hydronephrosis. The left kidney, left ureter, and urinary bladder appear unremarkable. Stomach/Bowel: There is no bowel obstruction or active inflammation. The appendix is normal. Vascular/Lymphatic: The  abdominal aorta and IVC are unremarkable. No portal venous gas. There is no adenopathy. Reproductive: The prostate and seminal vesicles are grossly remarkable. No pelvic mass. Other: None Musculoskeletal: No acute or significant osseous findings. IMPRESSION: 1. A 3 mm distal right ureteral stone with mild right hydronephrosis. 2. No bowel obstruction. Normal appendix. Electronically Signed   By: Elgie Collard M.D.   On: 06/18/2023 19:08    Pertinent labs & imaging results that were available during my care of the patient were reviewed by me and considered in my medical decision making (see MDM for details).  Medications Ordered in ED Medications  sodium chloride 0.9 % bolus 1,000 mL ( Intravenous Stopped 06/18/23 1617)  iohexol (OMNIPAQUE) 300 MG/ML solution 100 mL (100 mLs Intravenous Contrast Given 06/18/23 1554)  Procedures Procedures  (including critical care time)  Medical Decision Making / ED Course    Medical Decision Making:    Gust Salz is a 37 y.o. male with past medical history as below, significant for tibial platean fx, vitamin d deficiency who presents to the ED with complaint of abdominal pain.. The complaint involves an extensive differential diagnosis and also carries with it a high risk of complications and morbidity.  Serious etiology was considered. Ddx includes but is not limited to: Differential diagnosis includes but is not exclusive to acute appendicitis, renal colic, testicular torsion, urinary tract infection, prostatitis,  diverticulitis, small bowel obstruction, colitis, abdominal aortic aneurysm, gastroenteritis, constipation etc.   Complete initial physical exam performed, notably the patient  was no acute distress, HDS, abdomen nonperitoneal.    Reviewed and confirmed nursing documentation for past medical history, family  history, social history.  Vital signs reviewed.    Clinical Course as of 06/18/23 1928  Mon Jun 18, 2023  1919 CT shows ureteral stone with mild hydronephrosis. [SG]    Clinical Course User Index [SG] Sloan Leiter, DO     Labs reviewed, he has bacteria in his urine, he also has nephrolithiasis.  3mm, should hopefully pass w/o intervention. Will cover with empiric antibiotics and urine culture.  Does not appear to be septic.  Does not have septic stone.  Pain mild at this point.  Tolerant p.o. intake.  Oral analgesics and Flomax for home.  Follow-up with urology if symptoms persist.  The patient improved significantly and was discharged in stable condition. Detailed discussions were had with the patient regarding current findings, and need for close f/u with PCP or on call doctor. The patient has been instructed to return immediately if the symptoms worsen in any way for re-evaluation. Patient verbalized understanding and is in agreement with current care plan. All questions answered prior to discharge.                   Additional history obtained: -Additional history obtained from spouse -External records from outside source obtained and reviewed including: Chart review including previous notes, labs, imaging, consultation notes including  Home medications, prior admission    Lab Tests: -I ordered, reviewed, and interpreted labs.   The pertinent results include:   Labs Reviewed  URINALYSIS, ROUTINE W REFLEX MICROSCOPIC - Abnormal; Notable for the following components:      Result Value   APPearance CLOUDY (*)    Hgb urine dipstick LARGE (*)    Protein, ur 100 (*)    All other components within normal limits  URINALYSIS, MICROSCOPIC (REFLEX) - Abnormal; Notable for the following components:   Bacteria, UA RARE (*)    All other components within normal limits  URINE CULTURE  BASIC METABOLIC PANEL  CBC  HEPATIC FUNCTION PANEL  DIFFERENTIAL  RAPID URINE DRUG  SCREEN, HOSP PERFORMED    Notable for CBC and metabolic panel are stable, urinalysis with blood, no infection.  EKG   EKG Interpretation Date/Time:    Ventricular Rate:    PR Interval:    QRS Duration:    QT Interval:    QTC Calculation:   R Axis:      Text Interpretation:           Imaging Studies ordered: I ordered imaging studies including CT abdomen pelvis I independently visualized the following imaging with scope of interpretation limited to determining acute life threatening conditions related to emergency care; findings noted above I independently visualized  and interpreted imaging. I agree with the radiologist interpretation   Medicines ordered and prescription drug management: Meds ordered this encounter  Medications   sodium chloride 0.9 % bolus 1,000 mL   iohexol (OMNIPAQUE) 300 MG/ML solution 100 mL   tamsulosin (FLOMAX) 0.4 MG CAPS capsule    Sig: Take 1 capsule (0.4 mg total) by mouth daily for 14 days.    Dispense:  14 capsule    Refill:  0   traMADol (ULTRAM) 50 MG tablet    Sig: Take 1 tablet (50 mg total) by mouth every 6 (six) hours as needed.    Dispense:  15 tablet    Refill:  0   cephALEXin (KEFLEX) 500 MG capsule    Sig: Take 1 capsule (500 mg total) by mouth 3 (three) times daily for 7 days.    Dispense:  21 capsule    Refill:  0   ondansetron (ZOFRAN) 4 MG tablet    Sig: Take 1 tablet (4 mg total) by mouth every 4 (four) hours as needed for nausea or vomiting.    Dispense:  5 tablet    Refill:  0    -I have reviewed the patients home medicines and have made adjustments as needed   Consultations Obtained: na   Cardiac Monitoring: Continuous pulse oximetry interpreted by myself, 99% on room air.    Social Determinants of Health:  Diagnosis or treatment significantly limited by social determinants of health: former smoker   Reevaluation: After the interventions noted above, I reevaluated the patient and found that they have  improved  Co morbidities that complicate the patient evaluation  Past Medical History:  Diagnosis Date   Vitamin D insufficiency 02/14/2023      Dispostion: Disposition decision including need for hospitalization was considered, and patient discharged from emergency department.    Final Clinical Impression(s) / ED Diagnoses Final diagnoses:  Ureterolithiasis        Sloan Leiter, DO 06/18/23 1928

## 2023-06-18 NOTE — ED Notes (Signed)
D/c paperwork reviewed with pt, including prescriptions and follow up care.  All questions and/or concerns addressed at time of d/c.  No further needs expressed. . Pt verbalized understanding, Ambulatory with family to ED exit, NAD.   

## 2023-06-18 NOTE — ED Triage Notes (Signed)
Lower abdominal pain that started this am  Pain was in lower back this am when waking up  Denies urinary complaints   H/o kidney stone

## 2023-06-21 LAB — URINE CULTURE: Culture: 10000 — AB

## 2023-06-22 ENCOUNTER — Telehealth (HOSPITAL_BASED_OUTPATIENT_CLINIC_OR_DEPARTMENT_OTHER): Payer: Self-pay | Admitting: *Deleted

## 2023-06-22 NOTE — Telephone Encounter (Signed)
Post ED Visit - Positive Culture Follow-up  Culture report reviewed by antimicrobial stewardship pharmacist: [facility] [x]  Lennie Muckle, Pharm.D. []  Celedonio Miyamoto, Pharm.D., BCPS AQ-ID []  Garvin Fila, Pharm.D., BCPS []  Georgina Pillion, Pharm.D., BCPS []  [place], [place].D., BCPS, AAHIVP []  Estella Husk, Pharm.D., BCPS, AAHIVP []  Lysle Pearl, PharmD, BCPS []  Phillips Climes, PharmD, BCPS []  Agapito Games, PharmD, BCPS []  Verlan Friends, PharmD []  Mervyn Gay, PharmD, BCPS []  Vinnie Level, PharmD  [facility] []  Len Childs, PharmD []  Greer Pickerel, PharmD []  Adalberto Cole, PharmD []  Perlie Gold, Rph []  Lonell Face) Jean Rosenthal, PharmD []  Earl Many, PharmD []  Junita Push, PharmD []  Dorna Leitz, PharmD []  Terrilee Files, PharmD []  Lynann Beaver, PharmD []  Keturah Barre, PharmD []  Loralee Pacas, PharmD []  Bernadene Person, PharmD   Positive urine culture Treated with Cephalexin, organism sensitive to the same and no further patient follow-up is required at this time.  Virl Axe [facility] 06/22/2023, 11:21 AM

## 2023-08-13 DIAGNOSIS — M25461 Effusion, right knee: Secondary | ICD-10-CM | POA: Diagnosis not present

## 2023-08-13 DIAGNOSIS — M25561 Pain in right knee: Secondary | ICD-10-CM | POA: Diagnosis not present

## 2023-09-13 DIAGNOSIS — Z20828 Contact with and (suspected) exposure to other viral communicable diseases: Secondary | ICD-10-CM | POA: Diagnosis not present

## 2023-09-13 DIAGNOSIS — R509 Fever, unspecified: Secondary | ICD-10-CM | POA: Diagnosis not present

## 2023-09-13 DIAGNOSIS — R051 Acute cough: Secondary | ICD-10-CM | POA: Diagnosis not present

## 2023-09-13 DIAGNOSIS — J209 Acute bronchitis, unspecified: Secondary | ICD-10-CM | POA: Diagnosis not present

## 2024-01-01 ENCOUNTER — Other Ambulatory Visit (INDEPENDENT_AMBULATORY_CARE_PROVIDER_SITE_OTHER): Payer: Self-pay

## 2024-01-01 ENCOUNTER — Ambulatory Visit: Admitting: Orthopaedic Surgery

## 2024-01-01 DIAGNOSIS — G8929 Other chronic pain: Secondary | ICD-10-CM

## 2024-01-01 DIAGNOSIS — M25561 Pain in right knee: Secondary | ICD-10-CM

## 2024-01-01 MED ORDER — METHYLPREDNISOLONE ACETATE 40 MG/ML IJ SUSP
40.0000 mg | INTRAMUSCULAR | Status: AC | PRN
  Administered 2024-01-01: 40 mg via INTRA_ARTICULAR

## 2024-01-01 MED ORDER — LIDOCAINE HCL 1 % IJ SOLN
2.0000 mL | INTRAMUSCULAR | Status: AC | PRN
  Administered 2024-01-01: 2 mL

## 2024-01-01 MED ORDER — BUPIVACAINE HCL 0.5 % IJ SOLN
2.0000 mL | INTRAMUSCULAR | Status: AC | PRN
  Administered 2024-01-01: 2 mL via INTRA_ARTICULAR
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
## 2024-03-12 ENCOUNTER — Encounter: Payer: Self-pay | Admitting: Orthopaedic Surgery

## 2024-03-12 ENCOUNTER — Other Ambulatory Visit: Payer: Self-pay

## 2024-03-12 DIAGNOSIS — G8929 Other chronic pain: Secondary | ICD-10-CM

## 2024-03-12 NOTE — Telephone Encounter (Signed)
 Yes approve on the CT arthrogram.  Thanks.

## 2024-03-19 ENCOUNTER — Ambulatory Visit
Admission: RE | Admit: 2024-03-19 | Discharge: 2024-03-19 | Disposition: A | Discharge status: Home / Self Care | Source: Ambulatory Visit | Attending: Family Medicine | Admitting: Family Medicine

## 2024-03-19 VITALS — BP 132/76 | HR 96 | Temp 99.1°F | Resp 18 | Ht 69.0 in | Wt 210.0 lb

## 2024-03-19 DIAGNOSIS — R52 Pain, unspecified: Secondary | ICD-10-CM | POA: Diagnosis not present

## 2024-03-19 DIAGNOSIS — U071 COVID-19: Secondary | ICD-10-CM

## 2024-03-19 LAB — POC COVID19/FLU A&B COMBO
Covid Antigen, POC: POSITIVE — AB
Influenza A Antigen, POC: NEGATIVE
Influenza B Antigen, POC: NEGATIVE

## 2024-03-19 MED ORDER — CETIRIZINE HCL 10 MG PO TABS: 10.0000 mg | ORAL_TABLET | Freq: Every day | ORAL | 0 refills | Status: AC

## 2024-03-19 MED ORDER — PROMETHAZINE-DM 6.25-15 MG/5ML PO SYRP: 5.0000 mL | ORAL_SOLUTION | Freq: Three times a day (TID) | ORAL | 0 refills | Status: AC | PRN

## 2024-03-19 MED ORDER — PSEUDOEPHEDRINE HCL 30 MG PO TABS: 30.0000 mg | ORAL_TABLET | Freq: Three times a day (TID) | ORAL | 0 refills | Status: AC | PRN

## 2024-03-19 MED ORDER — IBUPROFEN 600 MG PO TABS: 600.0000 mg | ORAL_TABLET | Freq: Four times a day (QID) | ORAL | 0 refills | Status: AC | PRN

## 2024-03-19 NOTE — ED Provider Notes (Signed)
 [facility]  Note:  This document was prepared using Conservation officer, historic buildings and may include unintentional dictation errors.  MRN: [id number] DOB: [birth year]  Subjective:   Ryan Powers is a 38 y.o. male presenting for 1 day history of acute onset malaise, fatigue, body aches, fever, coughing.  No chest pain, shortness of breath or wheezing.  No smoking of any kind.  No asthma.  No current facility-administered medications for this encounter.  Current Outpatient Medications:    Phenylephrine -Acetaminophen  (TYLENOL  SINUS+HEADACHE PO), Take by mouth., Disp: , Rfl:    acetaminophen  (TYLENOL ) 500 MG tablet, Take 2 tablets (1,000 mg total) by mouth every 8 (eight) hours., Disp: 30 tablet, Rfl: 0   apixaban  (ELIQUIS ) 2.5 MG TABS tablet, Take 1 tablet (2.5 mg total) by mouth 2 (two) times daily., Disp: 60 tablet, Rfl: 0   ascorbic acid  (VITAMIN C ) 1000 MG tablet, Take 1 tablet (1,000 mg total) by mouth daily., Disp: 30 tablet, Rfl: 1   Cholecalciferol  125 MCG (5000 UT) TABS, Take 1 tablet by mouth daily., Disp: 100 tablet, Rfl: 6   methocarbamol  (ROBAXIN ) 500 MG tablet, Take 1-2 tablets (500-1,000 mg total) by mouth every 6 (six) hours as needed for muscle spasms., Disp: 60 tablet, Rfl: 0   naloxone  (NARCAN ) nasal spray 4 mg/0.1 mL, 1 spray in either nostril at signs of opioid overdose.  May repeat in opposite nostril with new spray in 2-3 minutes if no or minimal response such as no improvement in breathing or responsiveness, Disp: 2 each, Rfl: 1   ondansetron  (ZOFRAN ) 4 MG tablet, Take 1 tablet (4 mg total) by mouth every 4 (four) hours as needed for nausea or vomiting., Disp: 5 tablet, Rfl: 0   polyethylene glycol powder (GLYCOLAX /MIRALAX ) 17 GM/SCOOP powder, Take 17 g (1 capful) by mouth daily., Disp: 238 g, Rfl: 0   traMADol  (ULTRAM ) 50 MG tablet, Take 1 tablet (50 mg total) by mouth every 6 (six) hours as needed., Disp: 15 tablet, Rfl: 0   Allergies   Allergen Reactions   Oxycodone -Acetaminophen  Hives    Past Medical History:  Diagnosis Date   Vitamin D  insufficiency 02/14/2023     Past Surgical History:  Procedure Laterality Date   ORIF TIBIA PLATEAU Right 02/13/2023   Procedure: OPEN REDUCTION INTERNAL FIXATION (ORIF) TIBIAL PLATEAU; REPAIR LATERAL MENISCUS AND ANTERIOR FASCIOTOMY;  Surgeon: Celena Sharper, MD;  Location: [facility] OR;  Service: Orthopedics;  Laterality: Right;   WISDOM TOOTH EXTRACTION      History reviewed. No pertinent family history.  Social History   Tobacco Use   Smoking status: Former    Current packs/day: 0.00    Types: Cigarettes    Quit date: 02/17/2019    Years since quitting: 5.0   Smokeless tobacco: Never  Vaping Use   Vaping status: Never Used  Substance Use Topics   Alcohol use: Not Currently    Comment: weekends   Drug use: No    ROS   Objective:   Vitals: BP 132/76 (BP Location: Right Arm)   Pulse 96   Temp 99.1 F (37.3 C) (Oral)   Resp 18   Ht 5' 9 (1.753 m)   Wt 210 lb (95.3 kg)   SpO2 96%   BMI 31.01 kg/m   Physical Exam Constitutional:      General: He is not in acute distress.    Appearance: Normal appearance. He is well-developed. He is not ill-appearing, toxic-appearing or diaphoretic.  HENT:  Head: Normocephalic and atraumatic.     Right Ear: External ear normal.     Left Ear: External ear normal.     Nose: Nose normal.     Mouth/Throat:     Mouth: Mucous membranes are moist.  Eyes:     General: No scleral icterus.       Right eye: No discharge.        Left eye: No discharge.     Extraocular Movements: Extraocular movements intact.  Cardiovascular:     Rate and Rhythm: Normal rate and regular rhythm.     Heart sounds: Normal heart sounds. No murmur heard.    No friction rub. No gallop.  Pulmonary:     Effort: Pulmonary effort is normal. No respiratory distress.     Breath sounds: Normal breath sounds. No stridor. No wheezing, rhonchi or rales.   Neurological:     Mental Status: He is alert and oriented to person, place, and time.  Psychiatric:        Mood and Affect: Mood normal.        Behavior: Behavior normal.        Thought Content: Thought content normal.     Results for orders placed or performed during the hospital encounter of 03/19/24 (from the past 24 hours)  POC Covid19/Flu A&B Antigen     Status: Abnormal   Collection Time: 03/19/24 10:33 AM  Result Value Ref Range   Influenza A Antigen, POC Negative Negative   Influenza B Antigen, POC Negative Negative   Covid Antigen, POC Positive (A) Negative    Assessment and Plan :   PDMP not reviewed this encounter.  1. Body aches   2. COVID-19    Deferred imaging given clear cardiopulmonary exam, hemodynamically stable vital signs.  Positive COVID-19 test. Physical exam findings reassuring and vital signs stable for discharge. Advised supportive care, offered symptomatic relief. Counseled patient on potential for adverse effects with medications prescribed/recommended today, ER and return-to-clinic precautions discussed, patient verbalized understanding.     Christopher Savannah, [place] 03/19/24 1104

## 2024-03-19 NOTE — ED Triage Notes (Signed)
 I think I have the flu - Entered by patient  I feel like I have the Flu starting yesterday with body aches, coughing, fever (unknown degree). No exposure to Flu/COVID19 known. No testing at home. Taking Tylenol  Sinus/Hz PRN.

## 2024-03-19 NOTE — Discharge Instructions (Addendum)
 We will manage this COVID 19 infection with supportive care. For sore throat or cough try using a honey-based tea. Use 3 teaspoons of honey with juice squeezed from half lemon. Place shaved pieces of ginger into 1/2-1 cup of water and warm over stove top. Then mix the ingredients and repeat every 4 hours as needed. Please take ibuprofen  600mg  every 6 hours with food alternating with OR taken together with Tylenol  500mg -650mg  every 6 hours for throat pain, fevers, aches and pains. Hydrate very well with at least 2 liters of water. Eat light meals such as soups (chicken and noodles, vegetable, chicken and wild rice).  Do not eat foods that you are allergic to.  Taking an antihistamine like Zyrtec  (10mg  daily) can help against postnasal drainage, sinus congestion which can cause sinus pain, sinus headaches, throat pain, painful swallowing, coughing.  You can take this together with pseudoephedrine  (Sudafed) at a dose of 30mg  3 times a day or twice daily as needed for the same kind of nasal drip, congestion.  Use cough syrup as needed.

## 2024-03-25 ENCOUNTER — Ambulatory Visit
Admission: RE | Admit: 2024-03-25 | Discharge: 2024-03-25 | Disposition: A | Discharge status: Home / Self Care | Source: Ambulatory Visit | Attending: Orthopaedic Surgery | Admitting: Orthopaedic Surgery

## 2024-03-25 ENCOUNTER — Inpatient Hospital Stay
Admission: RE | Admit: 2024-03-25 | Discharge: 2024-03-25 | Disposition: A | Discharge status: Home / Self Care | Source: Ambulatory Visit | Attending: Orthopaedic Surgery | Admitting: Orthopaedic Surgery

## 2024-03-25 ENCOUNTER — Other Ambulatory Visit

## 2024-03-25 DIAGNOSIS — G8929 Other chronic pain: Secondary | ICD-10-CM

## 2024-03-25 DIAGNOSIS — M96671 Fracture of tibia or fibula following insertion of orthopedic implant, joint prosthesis, or bone plate, right leg: Secondary | ICD-10-CM | POA: Diagnosis not present

## 2024-03-25 DIAGNOSIS — M25561 Pain in right knee: Secondary | ICD-10-CM | POA: Diagnosis not present

## 2024-03-25 DIAGNOSIS — S82121D Displaced fracture of lateral condyle of right tibia, subsequent encounter for closed fracture with routine healing: Secondary | ICD-10-CM | POA: Diagnosis not present

## 2024-03-25 MED ORDER — IOPAMIDOL (ISOVUE-M 200) INJECTION 41%
20.0000 mL | Freq: Once | INTRAMUSCULAR | Status: AC
  Administered 2024-03-25: 20 mL via INTRA_ARTICULAR

## 2024-04-01 ENCOUNTER — Encounter: Payer: Self-pay | Admitting: Orthopaedic Surgery

## 2024-04-01 ENCOUNTER — Ambulatory Visit: Admitting: Orthopaedic Surgery

## 2024-04-01 DIAGNOSIS — S83271A Complex tear of lateral meniscus, current injury, right knee, initial encounter: Secondary | ICD-10-CM | POA: Diagnosis not present

## 2024-04-01 NOTE — Progress Notes (Signed)
 Office Visit Note   Patient: Ryan Powers           Date of Birth: 1985/10/03           MRN: [id number] Visit Date: 04/01/2024              Requested by: Center, [facility] [address] [place],  [place] [address] PCP: Center, [facility]   Assessment & Plan: Visit Diagnoses:  1. Complex tear of lateral meniscus of right knee as current injury, initial encounter     Plan: History of Present Illness Ryan Powers is a 38 year old male who presents with knee pain and limited extension following knee surgery.  He experiences ongoing anterior knee pain and an inability to fully extend his knee following a previous surgery. The pain is primarily located anteriorly, with no pain in other areas of the knee. He is unable to fully straighten his knee, particularly noticeable when compared to his other knee, which has not undergone surgery. He is experiencing some functional limitations due to the knee issue, impacting his daily activities.  Right knee exam shows pain across the anterior joint line.  Otherwise exam is unchanged.  Results RADIOLOGY Knee CT arthrogram: Fracture healed, possible meniscus tear, presence of plate and screws distorting clarity, tendinitis likely post-surgical, depression and resorption observed.  Assessment and Plan Status post right knee fracture repair with persistent pain, limited extension, possible lateral meniscus tear, and post-surgical IT band tendinitis Fracture healed. Persistent pain and limited extension likely due to scar tissue. Imaging limited by hardware, complicating meniscus tear assessment. Tendinitis likely post-surgical. - Suggested patient discuss diagnostic arthroscopy decision with wife. - Consider knee diagnostic arthroscopy if symptoms unmanageable.  Follow-Up Instructions: No follow-ups on file.   Orders:  No orders of the defined types were placed in this encounter.  No orders of the defined types were placed in this  encounter.     Procedures: No procedures performed   Clinical Data: No additional findings.   Subjective: Chief Complaint  Patient presents with   Right Knee - Follow-up    CT review    HPI  Review of Systems  Constitutional: Negative.   HENT: Negative.    Eyes: Negative.   Respiratory: Negative.    Cardiovascular: Negative.   Gastrointestinal: Negative.   Endocrine: Negative.   Genitourinary: Negative.   Skin: Negative.   Allergic/Immunologic: Negative.   Neurological: Negative.   Hematological: Negative.   Psychiatric/Behavioral: Negative.    All other systems reviewed and are negative.    Objective: Vital Signs: There were no vitals taken for this visit.  Physical Exam Vitals and nursing note reviewed.  Constitutional:      Appearance: He is well-developed.  Pulmonary:     Effort: Pulmonary effort is normal.  Abdominal:     Palpations: Abdomen is soft.  Skin:    General: Skin is warm.  Neurological:     Mental Status: He is alert and oriented to person, place, and time.  Psychiatric:        Behavior: Behavior normal.        Thought Content: Thought content normal.        Judgment: Judgment normal.     Ortho Exam  Specialty Comments:  No specialty comments available.  Imaging: No results found.   PMFS History: Patient Active Problem List   Diagnosis Date Noted   Vitamin D  insufficiency 02/14/2023   Closed fracture of right tibial plateau 02/12/2023   Tibial plateau  fracture, right 02/11/2023   Fracture of right tibial plateau 02/11/2023   Past Medical History:  Diagnosis Date   Vitamin D  insufficiency 02/14/2023    No family history on file.  Past Surgical History:  Procedure Laterality Date   ORIF TIBIA PLATEAU Right 02/13/2023   Procedure: OPEN REDUCTION INTERNAL FIXATION (ORIF) TIBIAL PLATEAU; REPAIR LATERAL MENISCUS AND ANTERIOR FASCIOTOMY;  Surgeon: Celena Sharper, MD;  Location: [facility] OR;  Service: Orthopedics;  Laterality:  Right;   WISDOM TOOTH EXTRACTION     Social History   Occupational History   Not on file  Tobacco Use   Smoking status: Former    Current packs/day: 0.00    Types: Cigarettes    Quit date: 02/17/2019    Years since quitting: 5.1   Smokeless tobacco: Never  Vaping Use   Vaping status: Never Used  Substance and Sexual Activity   Alcohol use: Not Currently    Comment: weekends   Drug use: No   Sexual activity: Not Currently

## 2024-04-22 DIAGNOSIS — Z0000 Encounter for general adult medical examination without abnormal findings: Secondary | ICD-10-CM | POA: Diagnosis not present

## 2024-04-22 DIAGNOSIS — F4310 Post-traumatic stress disorder, unspecified: Secondary | ICD-10-CM | POA: Diagnosis not present

## 2024-04-22 DIAGNOSIS — Z6832 Body mass index (BMI) 32.0-32.9, adult: Secondary | ICD-10-CM | POA: Diagnosis not present

## 2024-04-22 DIAGNOSIS — E669 Obesity, unspecified: Secondary | ICD-10-CM | POA: Diagnosis not present

## 2024-06-02 ENCOUNTER — Encounter: Payer: Self-pay | Admitting: Radiology
